# Patient Record
Sex: Female | Born: 1950 | Race: White | Hispanic: Refuse to answer | Marital: Married | Smoking: Never smoker
Health system: Southern US, Community
[De-identification: ages and names within clinical notes are randomized; demographics above are authoritative.]

## PROBLEM LIST (undated history)

## (undated) DIAGNOSIS — T4145XA Adverse effect of unspecified anesthetic, initial encounter: Secondary | ICD-10-CM

## (undated) DIAGNOSIS — T8859XA Other complications of anesthesia, initial encounter: Secondary | ICD-10-CM

---
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
## 2004-02-16 ENCOUNTER — Ambulatory Visit: Payer: Self-pay | Admitting: Internal Medicine

## 2005-09-14 ENCOUNTER — Ambulatory Visit: Payer: Self-pay | Admitting: Internal Medicine

## 2007-04-15 ENCOUNTER — Ambulatory Visit: Payer: Self-pay | Admitting: Nurse Practitioner

## 2009-05-10 ENCOUNTER — Ambulatory Visit: Payer: Self-pay | Admitting: Nurse Practitioner

## 2010-05-17 ENCOUNTER — Ambulatory Visit: Payer: Self-pay | Admitting: Family Medicine

## 2012-04-24 HISTORY — PX: SUBMANDIBULAR GLAND EXCISION: SHX2456

## 2015-09-03 ENCOUNTER — Other Ambulatory Visit: Payer: Self-pay | Admitting: Nurse Practitioner

## 2015-09-03 DIAGNOSIS — Z1231 Encounter for screening mammogram for malignant neoplasm of breast: Secondary | ICD-10-CM

## 2015-09-27 ENCOUNTER — Ambulatory Visit: Admission: RE | Admit: 2015-09-27 | Payer: BC Managed Care – PPO | Source: Ambulatory Visit

## 2015-11-18 ENCOUNTER — Ambulatory Visit
Admission: RE | Admit: 2015-11-18 | Discharge: 2015-11-18 | Disposition: A | Discharge status: Home / Self Care | Payer: BC Managed Care – PPO | Source: Ambulatory Visit | Attending: Nurse Practitioner | Admitting: Nurse Practitioner

## 2015-11-18 ENCOUNTER — Other Ambulatory Visit: Payer: Self-pay | Admitting: Nurse Practitioner

## 2015-11-18 DIAGNOSIS — Z1231 Encounter for screening mammogram for malignant neoplasm of breast: Secondary | ICD-10-CM | POA: Insufficient documentation

## 2016-09-07 ENCOUNTER — Other Ambulatory Visit: Payer: Self-pay | Admitting: Nurse Practitioner

## 2016-09-07 DIAGNOSIS — Z1231 Encounter for screening mammogram for malignant neoplasm of breast: Secondary | ICD-10-CM

## 2016-11-21 ENCOUNTER — Ambulatory Visit
Admission: RE | Admit: 2016-11-21 | Discharge: 2016-11-21 | Disposition: A | Discharge status: Home / Self Care | Payer: Medicare Other | Source: Ambulatory Visit | Attending: Nurse Practitioner | Admitting: Nurse Practitioner

## 2016-11-21 DIAGNOSIS — Z1231 Encounter for screening mammogram for malignant neoplasm of breast: Secondary | ICD-10-CM | POA: Diagnosis present

## 2017-09-10 ENCOUNTER — Other Ambulatory Visit: Payer: Self-pay | Admitting: Nurse Practitioner

## 2017-09-10 DIAGNOSIS — Z1231 Encounter for screening mammogram for malignant neoplasm of breast: Secondary | ICD-10-CM

## 2017-11-27 ENCOUNTER — Ambulatory Visit
Admission: RE | Admit: 2017-11-27 | Discharge: 2017-11-27 | Disposition: A | Discharge status: Home / Self Care | Payer: Medicare Other | Source: Ambulatory Visit | Attending: Nurse Practitioner | Admitting: Nurse Practitioner

## 2017-11-27 DIAGNOSIS — Z1231 Encounter for screening mammogram for malignant neoplasm of breast: Secondary | ICD-10-CM | POA: Diagnosis not present

## 2018-09-17 ENCOUNTER — Other Ambulatory Visit: Payer: Self-pay | Admitting: Nurse Practitioner

## 2018-09-17 DIAGNOSIS — R599 Enlarged lymph nodes, unspecified: Secondary | ICD-10-CM

## 2018-09-19 ENCOUNTER — Ambulatory Visit: Payer: Medicare Other

## 2018-09-20 ENCOUNTER — Encounter (INDEPENDENT_AMBULATORY_CARE_PROVIDER_SITE_OTHER): Payer: Self-pay

## 2018-09-20 ENCOUNTER — Ambulatory Visit
Admission: RE | Admit: 2018-09-20 | Discharge: 2018-09-20 | Disposition: A | Discharge status: Home / Self Care | Payer: Medicare Other | Source: Ambulatory Visit | Attending: Nurse Practitioner | Admitting: Nurse Practitioner

## 2018-09-20 ENCOUNTER — Other Ambulatory Visit: Payer: Self-pay

## 2018-09-20 DIAGNOSIS — R599 Enlarged lymph nodes, unspecified: Secondary | ICD-10-CM

## 2018-09-23 ENCOUNTER — Other Ambulatory Visit: Payer: Self-pay | Admitting: Nurse Practitioner

## 2018-09-23 DIAGNOSIS — L729 Follicular cyst of the skin and subcutaneous tissue, unspecified: Secondary | ICD-10-CM

## 2018-10-07 ENCOUNTER — Ambulatory Visit
Admission: RE | Admit: 2018-10-07 | Discharge: 2018-10-07 | Disposition: A | Discharge status: Home / Self Care | Payer: Medicare Other | Source: Ambulatory Visit | Attending: Nurse Practitioner | Admitting: Nurse Practitioner

## 2018-10-07 ENCOUNTER — Other Ambulatory Visit: Payer: Self-pay

## 2018-10-07 DIAGNOSIS — L729 Follicular cyst of the skin and subcutaneous tissue, unspecified: Secondary | ICD-10-CM | POA: Diagnosis not present

## 2018-10-07 DIAGNOSIS — I700 Atherosclerosis of aorta: Secondary | ICD-10-CM | POA: Insufficient documentation

## 2018-10-07 DIAGNOSIS — K4190 Unilateral femoral hernia, without obstruction or gangrene, not specified as recurrent: Secondary | ICD-10-CM | POA: Insufficient documentation

## 2018-10-07 MED ORDER — IOHEXOL 300 MG/ML  SOLN
100.0000 mL | Freq: Once | INTRAMUSCULAR | Status: AC | PRN
  Administered 2018-10-07: 100 mL via INTRAVENOUS

## 2018-11-01 ENCOUNTER — Ambulatory Visit: Payer: Self-pay | Admitting: Surgery

## 2018-11-01 NOTE — H&P (Addendum)
Subjective:   CC: Unilateral femoral hernia without obstruction or gangrene, recurrence not specified [K41.90]  HPI:  Patricia Bernard is a 68 y.o. female who was referred by Sallee Lange, NP for evaluation of above. Symptoms were first noted 4 months ago. Asymptomaic, but had a large lump there appear and now has decreased in size but still present.  Associated with nothing specific, exacerbated by nothing specific.  Lump is not reducible, but she states it does decrease in size at night. Patient has no symptoms of  difficulty urinating.    Past Medical History:  has a past medical history of Arthritis (2013), Chronic periodontal disease, IGT (impaired glucose tolerance), Inguinal hernia (09/2018), Osteoporosis, post-menopausal (02/16/04), Pure hypercholesterolemia, Retinal hemorrhage (2005), and Shingles (2006).  Past Surgical History:       Past Surgical History:  Procedure Laterality Date  . CESAREAN SECTION    . Dental Bone Graft  03/2018   Plant for dental implants x2 6/20  . ENDOMETRIAL BIOPSY  2005   2/2 post menopausal bleeding.  negative.  . Gum Surgery  2016  . Submandibular Gland Excision Right   . THERMAL ABLATION ENDOMETRIUM  2005   2/2 Post menopausal bleeding.    Family History: family history includes Bipolar disorder in her sister and sister; Coronary Artery Disease (Blocked arteries around heart) in her maternal aunt; Depression in her sister; Diabetes type II in her sister; High blood pressure (Hypertension) in her mother and sister; Hyperlipidemia (Elevated cholesterol) in her sister; Hyperthyroidism in her mother; Hypothyroidism in her sister; Myocardial Infarction (Heart attack) (age of onset: 50) in her paternal uncle; Osteoporosis (Thinning of bones) in her sister and sister; Stomach cancer (age of onset: 12) in her father; Stroke in her maternal aunt; Uterine cancer in her sister.  Social History:  reports that she has quit smoking. Her  smoking use included cigarettes. She smoked 0.00 packs per day for 20.00 years. She has never used smokeless tobacco. She reports current alcohol use. She reports that she does not use drugs.  Current Medications: has a current medication list which includes the following prescription(s): ascorbic acid (vitamin c), aspirin, docosahexaenoic acid/epa, garlic, magnesium carbonate, and rosuvastatin.  Allergies:       Allergies as of 11/01/2018 - Reviewed 11/01/2018  Allergen Reaction Noted  . Bisphosphonates Other (See Comments)     ROS:  A 15 point review of systems was performed and pertinent positives and negatives noted in HPI   Objective:   BP 157/77   Pulse 63   Ht 153.7 cm (5' 0.5")   Wt 52.2 kg (115 lb)   BMI 22.09 kg/m   Constitutional :  alert, appears stated age, cooperative and no distress  Lymphatics/Throat:  no asymmetry, masses, or scars  Respiratory:  clear to auscultation bilaterally  Cardiovascular:  regular rate and rhythm  Gastrointestinal: soft, non-tender; bowel sounds normal; no masses,  no organomegaly. femoral hernia noted.  small, incarcerated and no overlying skin changes, non-tender, RIGHT  Musculoskeletal: Steady gait and movement  Skin: Cool and moist, visible surgical scars midline from previous c-section  Psychiatric: Normal affect, non-agitated, not confused       LABS:  n/a   RADS:    CLINICAL DATA: Right lower quadrant cyst  EXAM: CT ABDOMEN AND PELVIS WITH CONTRAST  TECHNIQUE: Multidetector CT imaging of the abdomen and pelvis was performed using the standard protocol following bolus administration of intravenous contrast.  CONTRAST: 146mL OMNIPAQUE IOHEXOL 300 MG/ML SOLN, additional oral enteric  contrast  COMPARISON: Ultrasound, 09/20/2018  FINDINGS: Lower chest: No acute abnormality.  Hepatobiliary: No solid liver abnormality is seen. No gallstones, gallbladder wall thickening, or biliary dilatation.  Pancreas:  Unremarkable. No pancreatic ductal dilatation or surrounding inflammatory changes.  Spleen: Normal in size without significant abnormality.  Adrenals/Urinary Tract: Adrenal glands are unremarkable. Kidneys are normal, without renal calculi, solid lesion, or hydronephrosis. Bladder is unremarkable.  Stomach/Bowel: Stomach is within normal limits. Appendix appears normal. No evidence of bowel wall thickening, distention, or inflammatory changes.  Vascular/Lymphatic: Aortic atherosclerosis. No enlarged abdominal or pelvic lymph nodes.  Reproductive: No mass or other significant abnormality.  Other: There is a small right femoral hernia containing fat and a fluid attenuation object. This hernia measures 3.5 x 2.6 x 3.5 cm (series 2, image 70, series 5, image 32). No abdominopelvic ascites.  Musculoskeletal: No acute or significant osseous findings.  IMPRESSION: 1. There is a small right femoral hernia containing fat and a fluid attenuation object, which is likely a small volume of fluid although is possibly the right ovary with a cyst given appearance on prior ultrasound. This hernia measures 3.5 x 2.6 x 3.5 cm (series 2, image 70, series 5, image 32).  2. Other chronic, incidental, and postoperative findings as detailed above.   Electronically Signed  By: Lauralyn PrimesAlex Bibbey M.D.  On: 10/07/2018 15:24  Other Result Information     Assessment:       Unilateral femoral hernia without obstruction or gangrene, recurrence not specified [K41.90]   Plan:   1. Unilateral femoral hernia without obstruction or gangrene, recurrence not specified [K41.90]   Discussed the risk of surgery including recurrence, which can be up to 50% in the case of incisional or complex hernias, possible use of prosthetic materials (mesh) and the increased risk of mesh infxn if used, bleeding, chronic pain, post-op infxn, post-op SBO or ileus, and possible re-operation to address said risks. The risks  of general anesthetic, if used, includes MI, CVA, sudden death or even reaction to anesthetic medications also discussed. Alternatives include continued observation.  Benefits include possible symptom relief, prevention of incarceration, strangulation, enlargement in size over time, and the risk of emergency surgery in the face of strangulation.   Typical post-op recovery time of 3-5 days with 4-6 weeks of activity restrictions were also discussed.  ED return precautions given for sudden increase in pain, size of hernia with accompanying fever, nausea, and/or vomiting.  The patient verbalized understanding and all questions were answered to the patient's satisfaction.   2. Due to increased risk of complications from a femoral hernia compared to other hernias, recommended repair even if it is asymptomatic.  Robotic-assisted approach will be best for the location.  OB-GYN will be involved with bilateral oophorectomy at the same time, due to possible involvement.  Please refer to Dr. Jolyn NapWard's note for further details

## 2018-12-03 ENCOUNTER — Other Ambulatory Visit: Payer: Self-pay

## 2018-12-03 ENCOUNTER — Encounter
Admission: RE | Admit: 2018-12-03 | Discharge: 2018-12-03 | Disposition: A | Discharge status: Home / Self Care | Payer: Medicare Other | Source: Ambulatory Visit | Attending: Surgery | Admitting: Surgery

## 2018-12-03 DIAGNOSIS — E7800 Pure hypercholesterolemia, unspecified: Secondary | ICD-10-CM | POA: Diagnosis not present

## 2018-12-03 DIAGNOSIS — K4030 Unilateral inguinal hernia, with obstruction, without gangrene, not specified as recurrent: Secondary | ICD-10-CM | POA: Diagnosis not present

## 2018-12-03 DIAGNOSIS — Z20828 Contact with and (suspected) exposure to other viral communicable diseases: Secondary | ICD-10-CM | POA: Diagnosis not present

## 2018-12-03 DIAGNOSIS — N83201 Unspecified ovarian cyst, right side: Secondary | ICD-10-CM | POA: Diagnosis not present

## 2018-12-03 DIAGNOSIS — Z87891 Personal history of nicotine dependence: Secondary | ICD-10-CM | POA: Diagnosis not present

## 2018-12-03 DIAGNOSIS — Z7982 Long term (current) use of aspirin: Secondary | ICD-10-CM | POA: Diagnosis not present

## 2018-12-03 DIAGNOSIS — K4190 Unilateral femoral hernia, without obstruction or gangrene, not specified as recurrent: Secondary | ICD-10-CM | POA: Diagnosis present

## 2018-12-03 DIAGNOSIS — K660 Peritoneal adhesions (postprocedural) (postinfection): Secondary | ICD-10-CM | POA: Diagnosis not present

## 2018-12-03 HISTORY — DX: Other complications of anesthesia, initial encounter: T88.59XA

## 2018-12-03 LAB — SARS CORONAVIRUS 2 (TAT 6-24 HRS): SARS Coronavirus 2: NEGATIVE

## 2018-12-03 NOTE — Progress Notes (Signed)
During the PAT visit, the patient expressed her wish to have her surgical consent with Dr. Leonides Schanz changed to only include the right salpingo-oopherectomy NOT bilateral as previously stated. I called Dr. Guido Sander office and spoke with her nurse. She will make Dr. Leonides Schanz aware. A blank consent is placed on the chart to be filled out and signed morning of surgery.

## 2018-12-03 NOTE — Patient Instructions (Addendum)
Your procedure is scheduled on: Friday 12/06/18  Report to [facility]. To find out your arrival time please call [phone] between 1PM - 3PM on Thursday 12/05/18.  Remember: Instructions that are not followed completely may result in serious medical risk, up to and including death, or upon the discretion of your surgeon and anesthesiologist your surgery may need to be rescheduled.      _X__ 1. Do not eat food after midnight the night before your procedure.                 No gum chewing or hard candies. You may drink clear liquids up to 2 hours                 before you are scheduled to arrive for your surgery- DO NOT drink clear                 liquids within 2 hours of the start of your surgery.                 Clear Liquids include:  water, apple juice without pulp, clear carbohydrate                 drink such as Clearfast or Gatorade, Black Coffee or Tea (Do not add                 milk or creamer to coffee or tea).   __X__2.  On the morning of surgery brush your teeth with toothpaste and water, you may rinse your mouth with mouthwash if you wish.  Do not swallow any toothpaste or mouthwash.      _X__ 3.  No Alcohol for 24 hours before or after surgery.   __X__4.  Notify your doctor if there is any change in your medical condition      (cold, fever, infections).     Do not wear jewelry, make-up, hairpins, clips or nail polish. Do not wear lotions, powders, or perfumes.  Do not shave 48 hours prior to surgery. Men may shave face and neck. Do not bring valuables to the hospital.    [facility] is not responsible for any belongings or valuables.   Contacts, dentures/partials or body piercings may not be worn into surgery. Bring a case for your contacts, glasses or hearing aids, a denture cup will be supplied.   Patients discharged the day of surgery will not be allowed to drive home.   __X__ Take these medicines the  morning of surgery with A SIP OF WATER:     1. NONE    __X__ Use CHG Soap as directed   __X__ Stop Anti-inflammatories 7 days before surgery such as Advil, Ibuprofen, Motrin, BC or Goodies Powder, Naprosyn, Naproxen, Aleve, Aspirin, Meloxicam. May take Tylenol if needed for pain or discomfort.  **You stopped taking your daily low dose Aspirin on 11/30/18.    __X__ Stop all herbal supplements prior to your procedure.

## 2018-12-03 NOTE — Progress Notes (Signed)
EKG reviewed by Dr. Randa Lynn. Same as one in [place]. Okay to proceed.

## 2018-12-05 MED ORDER — CEFAZOLIN SODIUM-DEXTROSE 2-4 GM/100ML-% IV SOLN
2.0000 g | INTRAVENOUS | Status: AC
  Administered 2018-12-06: 2 g via INTRAVENOUS

## 2018-12-06 ENCOUNTER — Ambulatory Visit: Payer: Medicare Other | Admitting: Anesthesiology

## 2018-12-06 ENCOUNTER — Other Ambulatory Visit: Payer: Self-pay

## 2018-12-06 ENCOUNTER — Ambulatory Visit
Admission: RE | Admit: 2018-12-06 | Discharge: 2018-12-06 | Disposition: A | Discharge status: Home / Self Care | Payer: Medicare Other | Attending: Surgery | Admitting: Surgery

## 2018-12-06 ENCOUNTER — Encounter
Admission: RE | Disposition: A | Discharge status: Home / Self Care | Payer: Self-pay | Source: Home / Self Care | Attending: Surgery

## 2018-12-06 ENCOUNTER — Encounter: Payer: Self-pay | Admitting: *Deleted

## 2018-12-06 DIAGNOSIS — Z87891 Personal history of nicotine dependence: Secondary | ICD-10-CM | POA: Insufficient documentation

## 2018-12-06 DIAGNOSIS — K4090 Unilateral inguinal hernia, without obstruction or gangrene, not specified as recurrent: Secondary | ICD-10-CM

## 2018-12-06 DIAGNOSIS — K4030 Unilateral inguinal hernia, with obstruction, without gangrene, not specified as recurrent: Secondary | ICD-10-CM | POA: Diagnosis not present

## 2018-12-06 DIAGNOSIS — K660 Peritoneal adhesions (postprocedural) (postinfection): Secondary | ICD-10-CM | POA: Insufficient documentation

## 2018-12-06 DIAGNOSIS — Z20828 Contact with and (suspected) exposure to other viral communicable diseases: Secondary | ICD-10-CM | POA: Insufficient documentation

## 2018-12-06 DIAGNOSIS — E7800 Pure hypercholesterolemia, unspecified: Secondary | ICD-10-CM | POA: Insufficient documentation

## 2018-12-06 DIAGNOSIS — N83201 Unspecified ovarian cyst, right side: Secondary | ICD-10-CM | POA: Insufficient documentation

## 2018-12-06 DIAGNOSIS — Z7982 Long term (current) use of aspirin: Secondary | ICD-10-CM | POA: Insufficient documentation

## 2018-12-06 HISTORY — PX: ROBOTIC ASSISTED SALPINGO OOPHERECTOMY: SHX6082

## 2018-12-06 HISTORY — PX: XI ROBOTIC ASSISTED INGUINAL HERNIA REPAIR WITH MESH: SHX6706

## 2018-12-06 LAB — URINE DRUG SCREEN, QUALITATIVE (ARMC ONLY)
Amphetamines, Ur Screen: NOT DETECTED
Barbiturates, Ur Screen: NOT DETECTED
Benzodiazepine, Ur Scrn: NOT DETECTED
Cannabinoid 50 Ng, Ur ~~LOC~~: NOT DETECTED
Cocaine Metabolite,Ur ~~LOC~~: NOT DETECTED
MDMA (Ecstasy)Ur Screen: NOT DETECTED
Methadone Scn, Ur: NOT DETECTED
Opiate, Ur Screen: NOT DETECTED
Phencyclidine (PCP) Ur S: NOT DETECTED
Tricyclic, Ur Screen: NOT DETECTED

## 2018-12-06 SURGERY — REPAIR, HERNIA, INGUINAL, ROBOT-ASSISTED, LAPAROSCOPIC, USING MESH
Anesthesia: General | Site: Abdomen | Laterality: Right

## 2018-12-06 MED ORDER — SUGAMMADEX SODIUM 200 MG/2ML IV SOLN
INTRAVENOUS | Status: DC | PRN
  Administered 2018-12-06: 150 mg via INTRAVENOUS

## 2018-12-06 MED ORDER — BUPIVACAINE-EPINEPHRINE 0.5% -1:200000 IJ SOLN
INTRAMUSCULAR | Status: DC | PRN
  Administered 2018-12-06: 10 mL

## 2018-12-06 MED ORDER — DEXAMETHASONE SODIUM PHOSPHATE 10 MG/ML IJ SOLN
INTRAMUSCULAR | Status: DC | PRN
  Administered 2018-12-06: 10 mg via INTRAVENOUS

## 2018-12-06 MED ORDER — SUCCINYLCHOLINE CHLORIDE 20 MG/ML IJ SOLN
INTRAMUSCULAR | Status: DC | PRN
  Administered 2018-12-06: 80 mg via INTRAVENOUS

## 2018-12-06 MED ORDER — MEPERIDINE HCL 50 MG/ML IJ SOLN: 6.2500 mg | INTRAMUSCULAR | Status: DC | PRN

## 2018-12-06 MED ORDER — ACETAMINOPHEN 325 MG PO TABS: 650.0000 mg | ORAL_TABLET | Freq: Three times a day (TID) | ORAL | 0 refills | Status: AC | PRN

## 2018-12-06 MED ORDER — FAMOTIDINE 20 MG PO TABS
ORAL_TABLET | ORAL | Status: AC
  Administered 2018-12-06: 12:00:00 20 mg via ORAL
  Filled 2018-12-06: qty 1

## 2018-12-06 MED ORDER — OXYCODONE HCL 5 MG/5ML PO SOLN: 5.0000 mg | Freq: Once | ORAL | Status: DC | PRN

## 2018-12-06 MED ORDER — ONDANSETRON HCL 4 MG/2ML IJ SOLN
INTRAMUSCULAR | Status: AC
  Filled 2018-12-06: qty 2

## 2018-12-06 MED ORDER — PROMETHAZINE HCL 25 MG/ML IJ SOLN: 6.2500 mg | INTRAMUSCULAR | Status: DC | PRN

## 2018-12-06 MED ORDER — ACETAMINOPHEN 500 MG PO TABS
ORAL_TABLET | ORAL | Status: AC
  Administered 2018-12-06: 12:00:00 1000 mg via ORAL
  Filled 2018-12-06: qty 2

## 2018-12-06 MED ORDER — BUPIVACAINE LIPOSOME 1.3 % IJ SUSP
INTRAMUSCULAR | Status: AC
  Filled 2018-12-06: qty 20

## 2018-12-06 MED ORDER — CEFAZOLIN SODIUM-DEXTROSE 2-4 GM/100ML-% IV SOLN
INTRAVENOUS | Status: AC
  Filled 2018-12-06: qty 100

## 2018-12-06 MED ORDER — BUPIVACAINE LIPOSOME 1.3 % IJ SUSP
INTRAMUSCULAR | Status: DC | PRN
  Administered 2018-12-06: 20 mL

## 2018-12-06 MED ORDER — GLYCOPYRROLATE 0.2 MG/ML IJ SOLN
INTRAMUSCULAR | Status: DC | PRN
  Administered 2018-12-06: 0.2 mg via INTRAVENOUS

## 2018-12-06 MED ORDER — CELECOXIB 200 MG PO CAPS
ORAL_CAPSULE | ORAL | Status: AC
  Administered 2018-12-06: 200 mg via ORAL
  Filled 2018-12-06: qty 1

## 2018-12-06 MED ORDER — SUCCINYLCHOLINE CHLORIDE 20 MG/ML IJ SOLN
INTRAMUSCULAR | Status: AC
  Filled 2018-12-06: qty 1

## 2018-12-06 MED ORDER — HYDROCODONE-ACETAMINOPHEN 5-325 MG PO TABS: 1.0000 | ORAL_TABLET | Freq: Four times a day (QID) | ORAL | 0 refills | Status: AC | PRN

## 2018-12-06 MED ORDER — FENTANYL CITRATE (PF) 100 MCG/2ML IJ SOLN
INTRAMUSCULAR | Status: AC
  Administered 2018-12-06: 18:00:00 50 ug via INTRAVENOUS
  Filled 2018-12-06: qty 2

## 2018-12-06 MED ORDER — OXYCODONE HCL 5 MG PO TABS: 5.0000 mg | ORAL_TABLET | Freq: Once | ORAL | Status: DC | PRN

## 2018-12-06 MED ORDER — PROPOFOL 10 MG/ML IV BOLUS
INTRAVENOUS | Status: DC | PRN
  Administered 2018-12-06: 140 mg via INTRAVENOUS

## 2018-12-06 MED ORDER — DOCUSATE SODIUM 100 MG PO CAPS: 100.0000 mg | ORAL_CAPSULE | Freq: Two times a day (BID) | ORAL | 0 refills | Status: AC | PRN

## 2018-12-06 MED ORDER — SUGAMMADEX SODIUM 200 MG/2ML IV SOLN
INTRAVENOUS | Status: AC
  Filled 2018-12-06: qty 2

## 2018-12-06 MED ORDER — CELECOXIB 200 MG PO CAPS
200.0000 mg | ORAL_CAPSULE | ORAL | Status: AC
  Administered 2018-12-06: 12:00:00 200 mg via ORAL

## 2018-12-06 MED ORDER — ONDANSETRON HCL 4 MG/2ML IJ SOLN
INTRAMUSCULAR | Status: DC | PRN
  Administered 2018-12-06: 4 mg via INTRAVENOUS

## 2018-12-06 MED ORDER — IBUPROFEN 800 MG PO TABS: 800.0000 mg | ORAL_TABLET | Freq: Three times a day (TID) | ORAL | 0 refills | Status: AC | PRN

## 2018-12-06 MED ORDER — DEXAMETHASONE SODIUM PHOSPHATE 10 MG/ML IJ SOLN
INTRAMUSCULAR | Status: AC
  Filled 2018-12-06: qty 1

## 2018-12-06 MED ORDER — FENTANYL CITRATE (PF) 100 MCG/2ML IJ SOLN
25.0000 ug | INTRAMUSCULAR | Status: DC | PRN
  Administered 2018-12-06: 18:00:00 50 ug via INTRAVENOUS

## 2018-12-06 MED ORDER — BUPIVACAINE-EPINEPHRINE (PF) 0.5% -1:200000 IJ SOLN
INTRAMUSCULAR | Status: AC
  Filled 2018-12-06: qty 30

## 2018-12-06 MED ORDER — GABAPENTIN 300 MG PO CAPS
ORAL_CAPSULE | ORAL | Status: AC
  Administered 2018-12-06: 12:00:00 300 mg via ORAL
  Filled 2018-12-06: qty 1

## 2018-12-06 MED ORDER — PROPOFOL 10 MG/ML IV BOLUS
INTRAVENOUS | Status: AC
  Filled 2018-12-06: qty 20

## 2018-12-06 MED ORDER — LACTATED RINGERS IV SOLN
INTRAVENOUS | Status: DC
  Administered 2018-12-06 (×2): via INTRAVENOUS

## 2018-12-06 MED ORDER — GABAPENTIN 300 MG PO CAPS
300.0000 mg | ORAL_CAPSULE | ORAL | Status: AC
  Administered 2018-12-06: 12:00:00 300 mg via ORAL

## 2018-12-06 MED ORDER — LIDOCAINE HCL (CARDIAC) PF 100 MG/5ML IV SOSY
PREFILLED_SYRINGE | INTRAVENOUS | Status: DC | PRN
  Administered 2018-12-06: 70 mg via INTRAVENOUS

## 2018-12-06 MED ORDER — BUPIVACAINE HCL (PF) 0.5 % IJ SOLN
INTRAMUSCULAR | Status: AC
  Filled 2018-12-06: qty 30

## 2018-12-06 MED ORDER — CHLORHEXIDINE GLUCONATE CLOTH 2 % EX PADS: 6.0000 | MEDICATED_PAD | Freq: Once | CUTANEOUS | Status: DC

## 2018-12-06 MED ORDER — LIDOCAINE-EPINEPHRINE (PF) 1 %-1:200000 IJ SOLN
INTRAMUSCULAR | Status: AC
  Filled 2018-12-06: qty 30

## 2018-12-06 MED ORDER — FAMOTIDINE 20 MG PO TABS
20.0000 mg | ORAL_TABLET | Freq: Once | ORAL | Status: AC
  Administered 2018-12-06: 12:00:00 20 mg via ORAL

## 2018-12-06 MED ORDER — PHENYLEPHRINE HCL (PRESSORS) 10 MG/ML IV SOLN
INTRAVENOUS | Status: DC | PRN
  Administered 2018-12-06 (×3): 100 ug via INTRAVENOUS

## 2018-12-06 MED ORDER — ROCURONIUM BROMIDE 50 MG/5ML IV SOLN
INTRAVENOUS | Status: AC
  Filled 2018-12-06: qty 1

## 2018-12-06 MED ORDER — FENTANYL CITRATE (PF) 100 MCG/2ML IJ SOLN
INTRAMUSCULAR | Status: AC
  Filled 2018-12-06: qty 2

## 2018-12-06 MED ORDER — ACETAMINOPHEN 500 MG PO TABS
1000.0000 mg | ORAL_TABLET | ORAL | Status: AC
  Administered 2018-12-06: 12:00:00 1000 mg via ORAL

## 2018-12-06 MED ORDER — ROCURONIUM BROMIDE 100 MG/10ML IV SOLN
INTRAVENOUS | Status: DC | PRN
  Administered 2018-12-06: 10 mg via INTRAVENOUS
  Administered 2018-12-06: 30 mg via INTRAVENOUS
  Administered 2018-12-06 (×2): 10 mg via INTRAVENOUS

## 2018-12-06 MED ORDER — FENTANYL CITRATE (PF) 100 MCG/2ML IJ SOLN
INTRAMUSCULAR | Status: DC | PRN
  Administered 2018-12-06: 25 ug via INTRAVENOUS
  Administered 2018-12-06: 75 ug via INTRAVENOUS

## 2018-12-06 MED ORDER — LIDOCAINE HCL (PF) 2 % IJ SOLN
INTRAMUSCULAR | Status: AC
  Filled 2018-12-06: qty 10

## 2018-12-06 SURGICAL SUPPLY — 84 items
BAG INFUSER PRESSURE 100CC (MISCELLANEOUS) IMPLANT
BAG URINE DRAINAGE (UROLOGICAL SUPPLIES) ×3 IMPLANT
BLADE SURG SZ11 CARB STEEL (BLADE) ×8 IMPLANT
BNDG GAUZE 4.5X4.1 6PLY STRL (MISCELLANEOUS) ×3 IMPLANT
CANISTER SUCT 1200ML W/VALVE (MISCELLANEOUS) ×10 IMPLANT
CANNULA REDUC XI 12-8 STAPL (CANNULA) ×1
CANNULA REDUC XI 12-8MM STAPL (CANNULA) ×1
CANNULA REDUCER 12-8 DVNC XI (CANNULA) ×3 IMPLANT
CATH FOLEY 2WAY  5CC 16FR (CATHETERS)
CATH URTH 16FR FL 2W BLN LF (CATHETERS) ×3 IMPLANT
CHLORAPREP W/TINT 26 (MISCELLANEOUS) ×8 IMPLANT
COVER TIP SHEARS 8 DVNC (MISCELLANEOUS) ×3 IMPLANT
COVER TIP SHEARS 8MM DA VINCI (MISCELLANEOUS) ×2
COVER WAND RF STERILE (DRAPES) ×8 IMPLANT
DEFOGGER SCOPE WARMER CLEARIFY (MISCELLANEOUS) ×5 IMPLANT
DERMABOND ADVANCED (GAUZE/BANDAGES/DRESSINGS) ×2
DERMABOND ADVANCED .7 DNX12 (GAUZE/BANDAGES/DRESSINGS) ×3 IMPLANT
DRAPE 3/4 80X56 (DRAPES) ×5 IMPLANT
DRAPE ARM DVNC X/XI (DISPOSABLE) ×12 IMPLANT
DRAPE COLUMN DVNC XI (DISPOSABLE) ×3 IMPLANT
DRAPE DA VINCI XI ARM (DISPOSABLE) ×8
DRAPE DA VINCI XI COLUMN (DISPOSABLE) ×2
DRAPE LEGGINS SURG 28X43 STRL (DRAPES) ×3 IMPLANT
DRAPE UNDER BUTTOCK W/FLU (DRAPES) ×3 IMPLANT
ELECT REM PT RETURN 9FT ADLT (ELECTROSURGICAL) ×5
ELECTRODE REM PT RTRN 9FT ADLT (ELECTROSURGICAL) ×3 IMPLANT
ETHIBOND 2 0 GREEN CT 2 30IN (SUTURE) ×6 IMPLANT
GLOVE BIOGEL PI IND STRL 7.0 (GLOVE) ×6 IMPLANT
GLOVE BIOGEL PI INDICATOR 7.0 (GLOVE) ×4
GLOVE PI ORTHOPRO 6.5 (GLOVE) ×2
GLOVE PI ORTHOPRO STRL 6.5 (GLOVE) ×3 IMPLANT
GLOVE SURG SYN 6.5 ES PF (GLOVE) ×15 IMPLANT
GLOVE SURG SYN 6.5 PF PI (GLOVE) ×9 IMPLANT
GOWN STRL REUS W/ TWL LRG LVL3 (GOWN DISPOSABLE) ×15 IMPLANT
GOWN STRL REUS W/ TWL XL LVL3 (GOWN DISPOSABLE) ×3 IMPLANT
GOWN STRL REUS W/TWL LRG LVL3 (GOWN DISPOSABLE) ×6
GOWN STRL REUS W/TWL XL LVL3 (GOWN DISPOSABLE) ×2
GRASPER SUT TROCAR 14GX15 (MISCELLANEOUS) ×3 IMPLANT
IRRIGATION STRYKERFLOW (MISCELLANEOUS) IMPLANT
IRRIGATOR STRYKERFLOW (MISCELLANEOUS)
IRRIGATOR SUCT 8 DISP DVNC XI (IRRIGATION / IRRIGATOR) IMPLANT
IRRIGATOR SUCTION 8MM XI DISP (IRRIGATION / IRRIGATOR)
IV LACTATED RINGERS 1000ML (IV SOLUTION) ×3 IMPLANT
IV NS 1000ML (IV SOLUTION)
IV NS 1000ML BAXH (IV SOLUTION) IMPLANT
KIT PINK PAD W/HEAD ARE REST (MISCELLANEOUS) ×5
KIT PINK PAD W/HEAD ARM REST (MISCELLANEOUS) ×6 IMPLANT
KIT TURNOVER CYSTO (KITS) ×5 IMPLANT
L-HOOK LAP DISP 36CM (ELECTROSURGICAL)
LABEL OR SOLS (LABEL) ×5 IMPLANT
LHOOK LAP DISP 36CM (ELECTROSURGICAL) IMPLANT
LIGASURE VESSEL 5MM BLUNT TIP (ELECTROSURGICAL) IMPLANT
MANIPULATOR UTERINE 4.5 ZUMI (MISCELLANEOUS) IMPLANT
MESH 3DMAX 4X6 RT LRG (Mesh General) ×5 IMPLANT
NEEDLE HYPO 22GX1.5 SAFETY (NEEDLE) ×5 IMPLANT
NEEDLE VERESS 14GA 120MM (NEEDLE) ×5 IMPLANT
NS IRRIG 500ML POUR BTL (IV SOLUTION) ×5 IMPLANT
OBTURATOR OPTICAL STANDARD 8MM (TROCAR) ×2
OBTURATOR OPTICAL STND 8 DVNC (TROCAR) ×3
OBTURATOR OPTICALSTD 8 DVNC (TROCAR) ×3 IMPLANT
PACK LAP CHOLECYSTECTOMY (MISCELLANEOUS) ×8 IMPLANT
PAD OB MATERNITY 4.3X12.25 (PERSONAL CARE ITEMS) ×3 IMPLANT
PAD PREP 24X41 OB/GYN DISP (PERSONAL CARE ITEMS) ×3 IMPLANT
POUCH SPECIMEN RETRIEVAL 10MM (ENDOMECHANICALS) ×2 IMPLANT
SEAL CANN UNIV 5-8 DVNC XI (MISCELLANEOUS) ×6 IMPLANT
SEAL XI 5MM-8MM UNIVERSAL (MISCELLANEOUS) ×4
SET TUBE SMOKE EVAC HIGH FLOW (TUBING) ×3 IMPLANT
SLEEVE ENDOPATH XCEL 5M (ENDOMECHANICALS) ×6 IMPLANT
SOLUTION ELECTROLUBE (MISCELLANEOUS) ×5 IMPLANT
STAPLER CANNULA SEAL DVNC XI (STAPLE) ×3 IMPLANT
STAPLER CANNULA SEAL XI (STAPLE) ×2
SUT DVC VLOC 3-0 CL 6 P-12 (SUTURE) ×8 IMPLANT
SUT MNCRL 4-0 (SUTURE) ×4
SUT MNCRL 4-0 27XMFL (SUTURE) ×6
SUT MNCRL AB 4-0 PS2 18 (SUTURE) ×6 IMPLANT
SUT VIC AB 3-0 SH 27 (SUTURE) ×2
SUT VIC AB 3-0 SH 27X BRD (SUTURE) ×3 IMPLANT
SUT VICRYL 0 AB UR-6 (SUTURE) ×3 IMPLANT
SUTURE MNCRL 4-0 27XMF (SUTURE) ×3 IMPLANT
SYR 30ML LL (SYRINGE) ×3 IMPLANT
TRAY FOLEY MTR SLVR 16FR STAT (SET/KITS/TRAYS/PACK) ×5 IMPLANT
TROCAR ENDO BLADELESS 11MM (ENDOMECHANICALS) IMPLANT
TROCAR XCEL NON-BLD 5MMX100MML (ENDOMECHANICALS) ×8 IMPLANT
TUBING EVAC SMOKE HEATED PNEUM (TUBING) ×5 IMPLANT

## 2018-12-06 NOTE — Transfer of Care (Signed)
Immediate Anesthesia Transfer of Care Note  Patient: LASSIE DEMOREST  Procedure(s) Performed: XI ROBOTIC ASSISTED FEMORAL HERNIA REPAIR WITH MESH (N/A Abdomen) ROBOTIC ASSISTED SALPINGO OOPHORECTOMY (Right Abdomen)  Patient Location: PACU  Anesthesia Type:General  Level of Consciousness: awake and alert   Airway & Oxygen Therapy: Patient Spontanous Breathing and Patient connected to face mask oxygen  Post-op Assessment: Report given to RN and Post -op Vital signs reviewed and stable  Post vital signs: Reviewed and stable  Last Vitals:  Vitals Value Taken Time  BP 138/52 12/06/18 1751  Temp 37.2 C 12/06/18 1750  Pulse 54 12/06/18 1752  Resp 11 12/06/18 1752  SpO2 100 % 12/06/18 1752  Vitals shown include unvalidated device data.  Last Pain:  Vitals:   12/06/18 1750  TempSrc:   PainSc: Asleep         Complications: No apparent anesthesia complications

## 2018-12-06 NOTE — H&P (Signed)
Subjective:   CC: Unilateral femoral hernia without obstruction or gangrene, recurrence not specified [K41.90]  HPI: Patricia Bernard is a 68 y.o. female who was referred by Sallee Lange, NP for evaluation of above. Symptoms were first noted 4 months ago. Asymptomaic, but had a large lump there appear and now has decreased in size but still present. Associated with nothing specific, exacerbated by nothing specific. Lump is not reducible, but she states it does decrease in size at night. Patient has no symptoms of difficulty urinating.   Past Medical History: has a past medical history of Arthritis (2013), Chronic periodontal disease, IGT (impaired glucose tolerance), Inguinal hernia (09/2018), Osteoporosis, post-menopausal (02/16/04), Pure hypercholesterolemia, Retinal hemorrhage (2005), and Shingles (2006).  Past Surgical History:  Past Surgical History:  Procedure Laterality Date  . CESAREAN SECTION  . Dental Bone Graft 03/2018  Plant for dental implants x2 6/20  . ENDOMETRIAL BIOPSY 2005  2/2 post menopausal bleeding. negative.  . Gum Surgery 2016  . Submandibular Gland Excision Right  . THERMAL ABLATION ENDOMETRIUM 2005  2/2 Post menopausal bleeding.   Family History: family history includes Bipolar disorder in her sister and sister; Coronary Artery Disease (Blocked arteries around heart) in her maternal aunt; Depression in her sister; Diabetes type II in her sister; High blood pressure (Hypertension) in her mother and sister; Hyperlipidemia (Elevated cholesterol) in her sister; Hyperthyroidism in her mother; Hypothyroidism in her sister; Myocardial Infarction (Heart attack) (age of onset: 74) in her paternal uncle; Osteoporosis (Thinning of bones) in her sister and sister; Stomach cancer (age of onset: 73) in her father; Stroke in her maternal aunt; Uterine cancer in her sister.  Social History: reports that she has quit smoking. Her smoking use included cigarettes. She smoked  0.00 packs per day for 20.00 years. She has never used smokeless tobacco. She reports current alcohol use. She reports that she does not use drugs.  Current Medications: has a current medication list which includes the following prescription(s): ascorbic acid (vitamin c), aspirin, docosahexaenoic acid/epa, garlic, magnesium carbonate, and rosuvastatin.  Allergies:  Allergies as of 11/01/2018 - Reviewed 11/01/2018  Allergen Reaction Noted  . Bisphosphonates Other (See Comments)   ROS:  A 15 point review of systems was performed and pertinent positives and negatives noted in HPI  Objective:    BP 157/77  Pulse 63  Ht 153.7 cm (5' 0.5")  Wt 52.2 kg (115 lb)  BMI 22.09 kg/m   Constitutional : alert, appears stated age, cooperative and no distress  Lymphatics/Throat: no asymmetry, masses, or scars  Respiratory: clear to auscultation bilaterally  Cardiovascular: regular rate and rhythm  Gastrointestinal: soft, non-tender; bowel sounds normal; no masses, no organomegaly. femoral hernia noted. small, incarcerated and no overlying skin changes, non-tender, RIGHT  Musculoskeletal: Steady gait and movement  Skin: Cool and moist, visible surgical scars midline from previous c-section  Psychiatric: Normal affect, non-agitated, not confused    LABS:  n/a   RADS: CLINICAL DATA: Right lower quadrant cyst  EXAM: CT ABDOMEN AND PELVIS WITH CONTRAST  TECHNIQUE: Multidetector CT imaging of the abdomen and pelvis was performed using the standard protocol following bolus administration of intravenous contrast.  CONTRAST: 160mL OMNIPAQUE IOHEXOL 300 MG/ML SOLN, additional oral enteric contrast  COMPARISON: Ultrasound, 09/20/2018  FINDINGS: Lower chest: No acute abnormality.  Hepatobiliary: No solid liver abnormality is seen. No gallstones, gallbladder wall thickening, or biliary dilatation.  Pancreas: Unremarkable. No pancreatic ductal dilatation or surrounding inflammatory  changes.  Spleen: Normal in size without significant  abnormality.  Adrenals/Urinary Tract: Adrenal glands are unremarkable. Kidneys are normal, without renal calculi, solid lesion, or hydronephrosis. Bladder is unremarkable.  Stomach/Bowel: Stomach is within normal limits. Appendix appears normal. No evidence of bowel wall thickening, distention, or inflammatory changes.  Vascular/Lymphatic: Aortic atherosclerosis. No enlarged abdominal or pelvic lymph nodes.  Reproductive: No mass or other significant abnormality.  Other: There is a small right femoral hernia containing fat and a fluid attenuation object. This hernia measures 3.5 x 2.6 x 3.5 cm (series 2, image 70, series 5, image 32). No abdominopelvic ascites.  Musculoskeletal: No acute or significant osseous findings.  IMPRESSION: 1. There is a small right femoral hernia containing fat and a fluid attenuation object, which is likely a small volume of fluid although is possibly the right ovary with a cyst given appearance on prior ultrasound. This hernia measures 3.5 x 2.6 x 3.5 cm (series 2, image 70, series 5, image 32).  2. Other chronic, incidental, and postoperative findings as detailed above.  Electronically Signed By: Lauralyn PrimesAlex Bibbey M.D. On: 10/07/2018 15:24  Other Result Information   Assessment:    Unilateral femoral hernia without obstruction or gangrene, recurrence not specified [K41.90]  Right ovarian cyst  Plan:    1. Unilateral femoral hernia without obstruction or gangrene, recurrence not specified [K41.90]  Discussed the risk of surgery including recurrence, which can be up to 50% in the case of incisional or complex hernias, possible use of prosthetic materials (mesh) and the increased risk of mesh infxn if used, bleeding, chronic pain, post-op infxn, post-op SBO or ileus, and possible re-operation to address said risks. The risks of general anesthetic, if used, includes MI, CVA, sudden death or  even reaction to anesthetic medications also discussed. Alternatives include continued observation. Benefits include possible symptom relief, prevention of incarceration, strangulation, enlargement in size over time, and the risk of emergency surgery in the face of strangulation.   Typical post-op recovery time of 3-5 days with 4-6 weeks of activity restrictions were also discussed.  ED return precautions given for sudden increase in pain, size of hernia with accompanying fever, nausea, and/or vomiting.  The patient verbalized understanding and all questions were answered to the patient's satisfaction.  2. Due to increased risk of complications from a femoral hernia compared to other hernias, recommended repair even if it is asymptomatic. Robotic-assisted approach will be best for the location. OB-GYN will be involved with bilateral oophorectomy at the same time, due to possible involvement. Please refer to Dr. Jolyn NapWard's note for further details

## 2018-12-06 NOTE — Anesthesia Procedure Notes (Signed)
Procedure Name: Intubation Performed by: Rona Ravens, CRNA Pre-anesthesia Checklist: Patient identified, Emergency Drugs available, Suction available, Patient being monitored and Timeout performed Patient Re-evaluated:Patient Re-evaluated prior to induction Oxygen Delivery Method: Circle system utilized Preoxygenation: Pre-oxygenation with 100% oxygen Induction Type: IV induction and Rapid sequence Laryngoscope Size: Mac and 3 Grade View: Grade II Tube type: Oral Tube size: 7.0 mm Number of attempts: 1 Airway Equipment and Method: Stylet Placement Confirmation: ETT inserted through vocal cords under direct vision,  positive ETCO2,  CO2 detector and breath sounds checked- equal and bilateral Secured at: 21 cm Tube secured with: Tape Dental Injury: Teeth and Oropharynx as per pre-operative assessment

## 2018-12-06 NOTE — Discharge Instructions (Signed)
Hernia repair, Care After This sheet gives you information about how to care for yourself after your procedure. Your health care provider may also give you more specific instructions. If you have problems or questions, contact your health care provider. What can I expect after the procedure? After your procedure, it is common to have the following:  Pain in your abdomen, especially in the incision areas. You will be given medicine to control the pain.  Tiredness. This is a normal part of the recovery process. Your energy level will return to normal over the next several weeks.  Changes in your bowel movements, such as constipation or needing to go more often. Talk with your health care provider about how to manage this. Follow these instructions at home: Medicines   tylenol and advil as needed for discomfort.  Please alternate between the two every four hours as needed for pain.     Use narcotics, if prescribed, only when tylenol and motrin is not enough to control pain.   325-650mg  every 8hrs to max of 3000mg /24hrs (including the 325mg  in every norco dose) for the tylenol.     Advil up to 800mg  per dose every 8hrs as needed for pain.    PLEASE RECORD NUMBER OF PILLS TAKEN UNTIL NEXT FOLLOW UP APPT.  THIS WILL HELP DETERMINE HOW READY YOU ARE TO BE RELEASED FROM ANY ACTIVITY RESTRICTIONS  Do not drive or use heavy machinery while taking prescription pain medicine.  Do not drink alcohol while taking prescription pain medicine.  Bilateral Salpingo-Oophorectomy, Care After This sheet gives you information about how to care for yourself after your procedure. Your health care provider may also give you more specific instructions. If you have problems or questions, contact your health care provider. What can I expect after the procedure? After the procedure, it is common to have:  Abdominal pain.  Some occasional vaginal bleeding (spotting).  Tiredness.  Symptoms of menopause, such as  hot flashes, night sweats, or mood swings. Follow these instructions at home: Incision care   Keep your incision area and your bandage (dressing) clean and dry.  Follow instructions from your health care provider about how to take care of your incision. Make sure you: ? Wash your hands with soap and water before you change your dressing. If soap and water are not available, use hand sanitizer. ? Change your dressing as told by your health care provider. ? Leave stitches (sutures), staples, skin glue, or adhesive strips in place. These skin closures may need to stay in place for 2 weeks or longer. If adhesive strip edges start to loosen and curl up, you may trim the loose edges. Do not remove adhesive strips completely unless your health care provider tells you to do that.  Check your incision area every day for signs of infection. Check for: ? Redness, swelling, or pain. ? Fluid or blood. ? Warmth. ? Pus or a bad smell. Activity   Do not drive or use heavy machinery while taking prescription pain medicine.  Do not drive for 24 hours if you received a medicine to help you relax (sedative) during your procedure.  Take frequent, short walks throughout the day. Rest when you get tired. Ask your health care provider what activities are safe for you.  Avoid activity that requires great effort. Also, avoid heavy lifting. Do not lift anything that is heavier than 10 lbs. (4.5 kg), or the limit that your health care provider tells you, until he or she says that  it is safe to do so.  Do not douche, use tampons, or have sex until your health care provider approves. General instructions   To prevent or treat constipation while you are taking prescription pain medicine, your health care provider may recommend that you: ? Drink enough fluid to keep your urine clear or pale yellow. ? Take over-the-counter or prescription medicines. ? Eat foods that are high in fiber, such as fresh fruits and  vegetables, whole grains, and beans. ? Limit foods that are high in fat and processed sugars, such as fried and sweet foods.  Take over-the-counter and prescription medicines only as told by your health care provider.  Do not take baths, swim, or use a hot tub until your health care provider approves. Ask your health care provider if you can take showers. You may only be allowed to take sponge baths for bathing.  Wear compression stockings as told by your health care provider. These stockings help to prevent blood clots and reduce swelling in your legs.  Keep all follow-up visits as told by your health care provider. This is important. Contact a health care provider if:  You have pain when you urinate.  You have pus or a bad smelling discharge coming from your vagina.  You have redness, swelling, or pain around your incision.  You have fluid or blood coming from your incision.  Your incision feels warm to the touch.  You have pus or a bad smell coming from your incision.  You have a fever.  Your incision starts to break open.  You have pain in the abdomen, and it gets worse or does not get better when you take medicine.  You develop a rash.  You develop nausea and vomiting.  You feel lightheaded. Get help right away if:  You develop pain in your chest or leg.  You become short of breath.  You faint.  You have increased bleeding from your vagina. Summary  After the procedure, it is common to have pain, bleeding in the vagina, and symptoms of menopause.  Follow instructions from your health care provider about how to take care of your incision.  Follow instructions from your health care provider about activities and restrictions.  Check your incision every day for signs of infection and report any symptoms to your health care provider. This information is not intended to replace advice given to you by your health care provider. Make sure you discuss any questions  you have with your health care provider. Document Released: 04/10/2005 Document Revised: 06/14/2018 Document Reviewed: 05/15/2016 Elsevier Patient Education  2020 [employer].  Incision care     Follow instructions from your health care provider about how to take care of your incision areas. Make sure you: ? Keep your incisions clean and dry. ? Wash your hands with soap and water before and after applying medicine to the areas, and before and after changing your bandage (dressing). If soap and water are not available, use hand sanitizer. ? Change your dressing as told by your health care provider. ? Leave stitches (sutures), skin glue, or adhesive strips in place. These skin closures may need to stay in place for 2 weeks or longer. If adhesive strip edges start to loosen and curl up, you may trim the loose edges. Do not remove adhesive strips completely unless your health care provider tells you to do that.  Do not wear tight clothing over the incisions. Tight clothing may rub and irritate the incision areas, which  may cause the incisions to open.  Do not take baths, swim, or use a hot tub until your health care provider approves. OK TO SHOWER IN 24HRS.    Check your incision area every day for signs of infection. Check for: ? More redness, swelling, or pain. ? More fluid or blood. ? Warmth. ? Pus or a bad smell. Activity  Avoid lifting anything that is heavier than 10 lb (4.5 kg) for 2 weeks or until your health care provider says it is okay.  No pushing/pulling greater than 30lbs  You may resume normal activities as told by your health care provider. Ask your health care provider what activities are safe for you.  Take rest breaks during the day as needed. Eating and drinking  Follow instructions from your health care provider about what you can eat after surgery.  To prevent or treat constipation while you are taking prescription pain medicine, your health care provider may  recommend that you: ? Drink enough fluid to keep your urine clear or pale yellow. ? Take over-the-counter or prescription medicines. ? Eat foods that are high in fiber, such as fresh fruits and vegetables, whole grains, and beans. ? Limit foods that are high in fat and processed sugars, such as fried and sweet foods. General instructions  Ask your health care provider when you will need an appointment to get your sutures or staples removed.  Keep all follow-up visits as told by your health care provider. This is important. Contact a health care provider if:  You have more redness, swelling, or pain around your incisions.  You have more fluid or blood coming from the incisions.  Your incisions feel warm to the touch.  You have pus or a bad smell coming from your incisions or your dressing.  You have a fever.  You have an incision that breaks open (edges not staying together) after sutures or staples have been removed. Get help right away if:  You develop a rash.  You have chest pain or difficulty breathing.  You have pain or swelling in your legs.  You feel light-headed or you faint.  Your abdomen swells (becomes distended).  You have nausea or vomiting.  You have blood in your stool (feces). This information is not intended to replace advice given to you by your health care provider. Make sure you discuss any questions you have with your health care provider. Document Released: 10/28/2004 Document Revised: 12/28/2017 Document Reviewed: 01/10/2016 Elsevier Interactive Patient Education  2019 [employer]

## 2018-12-06 NOTE — Anesthesia Postprocedure Evaluation (Signed)
Anesthesia Post Note  Patient: Patricia Bernard  Procedure(s) Performed: XI ROBOTIC ASSISTED FEMORAL HERNIA REPAIR WITH MESH (N/A Abdomen) ROBOTIC ASSISTED SALPINGO OOPHORECTOMY (Right Abdomen)  Patient location during evaluation: PACU Anesthesia Type: General Level of consciousness: awake and alert Pain management: pain level controlled Vital Signs Assessment: post-procedure vital signs reviewed and stable Respiratory status: spontaneous breathing, nonlabored ventilation, respiratory function stable and patient connected to nasal cannula oxygen Cardiovascular status: blood pressure returned to baseline and stable Postop Assessment: no apparent nausea or vomiting Anesthetic complications: no     Last Vitals:  Vitals:   12/06/18 1850 12/06/18 1915  BP: (!) 130/58 (!) 146/56  Pulse: 65 68  Resp: 15   Temp: 37.4 C   SpO2: 96% 98%    Last Pain:  Vitals:   12/06/18 1915  TempSrc:   PainSc: 0-No pain                 Tarry Fountain S

## 2018-12-06 NOTE — Anesthesia Post-op Follow-up Note (Signed)
Anesthesia QCDR form completed.        

## 2018-12-06 NOTE — Op Note (Signed)
Preoperative diagnosis: right femoral Hernia, incarcerated  Postoperative diagnosis: right direct inguinal Hernia, incarcerated  Procedure: Robotic assisted laparoscopic right inguinal hernia repair with mesh Right salphingooophorectomy performed by Dr. Elesa MassedWard  Anesthesia: General  Surgeon: Dr. Tonna BoehringerSakai  Wound Classification: Clean  Specimen: none  Complications: None  Estimated Blood Loss: 20mL   Indications: Right inguinal hernia. Repair was indicated to avoid complications of incarceration, obstruction and pain, and a prosthetic mesh repair was elected.  See H&P for further details.  Findings: 1. Round ligament identified and preserved 2. Bard 3D max mesh used for repair 3. Adequate hemostasis achieved   Description of procedure: The patient was taken to the operating room. A time-out was completed verifying correct patient, procedure, site, positioning, and implant(s) and/or special equipment prior to beginning this procedure.  Right groin was prepped and draped in the usual sterile fashion. An incision was marked 20 cm above the pubic tubercle, slightly above the umbilicus  Foley catheter placed.  Palmer's point located due to hx of c-section and  Veress needle was inserted.  Saline drop test noted to be positive with gradual increase in pressure after initiation of gas insufflation.  15 mm of pressure was achieved prior to removing the Veress needle and then placing a 5 mm port via the Optiview technique through the previous needle insertion site.  Inspection of the area afterwards noted no injury to the surrounding organs during insertion of the needle and the Optiview port.  No adhesions noted within abdomen.  2 port sites were marked 8 cm to the lateral sides of the previously marked site, and a 8 mm robotic port was placed on the left and middle sites, 12 mm robotic port on the right side under direct supervision.  Local anesthesia  infused to the preplanned incision site prior to  insertion of the port, and exparel infused as an ilioinguinal block under direct visualization. The [employer] Vinci Xi platform was then brought into the operative field and docked to the ports.  Dr. Elesa MassedWard then performed the right salphingooophorectomy.  Please see her op note for details.  I resumed my portion below after she competed her portion.  Examination of the abdominal cavity noted a right direct inguinal hernia.  A peritoneal flap was created approximately 8cm cephalad to the defect by using scissors with electrocautery.  Dissection was carried down towards the pubic tubercle, developing the myopectineal orifice view.  Laterally the flap was carried towards the ASIS.  right hernia sac was noted with preperiotoneal fat within the defect, which was carefully dissected away from the adjacent tissues to be fully reduced out of hernia cavity.  Any bleeding was controlled with electrocautery.  After confirming adequate dissection and the peritoneal reflection completely down and away from the defect and round ligament, a Bard 3DMax large mesh was placed within the anterior abdominal wall, secured in place using 2-0 Vicryl on an SH needle immediately above the pubic tubercle.  After noting proper placement of the mesh with the peritoneal reflection deep to it, the previously created peritoneal flap was secured back up to the anterior abdominal wall using running 3-0 V-Lock on a noncutting needle. The small peritoneal defect created during reduction of sac was closed with v-lock as well.  One additional figure of eight used to close a far lateral defect.  No obvious bleeding noted at end or procedure after closure.  Needles were then removed out of the abdominal cavity, Xi platform undocked from the ports and removed off of  operative field.  The 12 mm port site closed with PMI device using 0 Vicryl, using the robotic camera.  Abdomen then desufflated under visualization and again, no obvious bleeding was noted and  remaining ports removed. All the skin incisions were then closed with a subcuticular stitch of Monocryl 4-0. Dermabond was applied. Foley catheter removed. The patient tolerated the procedure well and was taken to the postanesthesia care unit in stable condition. Sponge and instrument count correct at end of procedure.

## 2018-12-06 NOTE — Interval H&P Note (Signed)
History and Physical Interval Note:  12/06/2018 12:34 PM  Patricia Bernard  has presented today for surgery, with the diagnosis of K41.90 FEMORAL HERNIA WITH OUT OBSTRUCTION OR GANGRENE POSSIBLE BILATERAL OOPHORECTOMY.  The various methods of treatment have been discussed with the patient and family. After consideration of risks, benefits and other options for treatment, the patient has consented to  Procedure(s): XI ROBOTIC ASSISTED FEMORAL HERNIA REPAIR WITH MESH (N/A) LAPAROSCOPIC BILATERAL SALPINGO OOPHORECTOMY (Bilateral) as a surgical intervention.  The patient's history has been reviewed, patient examined, no change in status, stable for surgery.  I have reviewed the patient's chart and labs.  Questions were answered to the patient's satisfaction.     Patricia Bernard

## 2018-12-06 NOTE — Anesthesia Preprocedure Evaluation (Addendum)
Anesthesia Evaluation  Patient identified by MRN, date of birth, ID band Patient awake    Reviewed: Allergy & Precautions, NPO status , Patient's Chart, lab work & pertinent test results  History of Anesthesia Complications Negative for: history of anesthetic complications  Airway Mallampati: III  TM Distance: >3 FB Neck ROM: Full    Dental no notable dental hx.    Pulmonary neg pulmonary ROS, neg sleep apnea, neg COPD,    breath sounds clear to auscultation- rhonchi (-) wheezing      Cardiovascular Exercise Tolerance: Good (-) hypertension(-) CAD, (-) Past MI, (-) Cardiac Stents and (-) CABG  Rhythm:Regular Rate:Normal - Systolic murmurs and - Diastolic murmurs hyperlipidemia   Neuro/Psych neg Seizures negative neurological ROS  negative psych ROS   GI/Hepatic negative GI ROS, Neg liver ROS,   Endo/Other  negative endocrine ROSneg diabetes  Renal/GU negative Renal ROS     Musculoskeletal negative musculoskeletal ROS (+)   Abdominal (+) - obese,   Peds  Hematology negative hematology ROS (+)   Anesthesia Other Findings Past Medical History: No date: Complication of anesthesia     Comment:  Difficulty swallowing after neck surgery.   Reproductive/Obstetrics                            Anesthesia Physical Anesthesia Plan  ASA: II  Anesthesia Plan: General   Post-op Pain Management:    Induction: Intravenous  PONV Risk Score and Plan: 2 and Ondansetron, Dexamethasone and Midazolam  Airway Management Planned: Oral ETT  Additional Equipment:   Intra-op Plan:   Post-operative Plan: Extubation in OR  Informed Consent: I have reviewed the patients History and Physical, chart, labs and discussed the procedure including the risks, benefits and alternatives for the proposed anesthesia with the patient or authorized representative who has indicated his/her understanding and acceptance.      Dental advisory given  Plan Discussed with: CRNA and Anesthesiologist  Anesthesia Plan Comments:         Anesthesia Quick Evaluation

## 2018-12-07 NOTE — Op Note (Signed)
12/06/2018  PATIENT:  Patricia Bernard  68 y.o. female  PRE-OPERATIVE DIAGNOSIS:  K41.90 FEMORAL HERNIA WITH OUT OBSTRUCTION OR GANGRENE WITH POSSIBLE INVOLVEMENT OF RIGHT OVARY. RIGHT, POSSIBLY BILATERAL, SALPINGO-OOPHORECTOMY  POST-OPERATIVE DIAGNOSIS:  K41.90 FEMORAL HERNIA WITH OUT OBSTRUCTION OR GANGRENE RIGHT SALPINGO-OOPHORECTOMY  PROCEDURE:  Procedure(s): XI ROBOTIC ASSISTED FEMORAL HERNIA REPAIR WITH MESH (N/A) ROBOTIC ASSISTED SALPINGO OOPHORECTOMY (Right)  SURGEON:  Surgeon(s) and Role: Panel 1:    Tonna Boehringer* Sakai, Isami, DO - Primary Panel 2:    * Ward, Elenora Fenderhelsea C, MD - Primary  ANESTHESIA: GET  EBL:  Total I/O In: 1050 [I.V.:1050] Out: 355 [Urine:350; Blood:5]  DRAINS: foley to gravity   FINDINGS: normal upper abdomen, no scarring intra-abdominally, normal appearing uterus, bilateral tubes and ovaries.  Normal pelvis.  Adipose-containing right Inguinal hernia  SPECIMEN: right tube and ovary (see Dr. Geoffery LyonsSakai's note for other specimens)  DISPOSITION OF SPECIMEN:  To pathology  COMPLICATIONS: none apparent  PATIENT DISPOSITION:  VS stable to PACU    Indication for surgery: Patient had presented to Dr. Tonna BoehringerSakai with complaints of right sided groin bulge, and imaging could not rule out right adnexal involvement. Patricia Bernard was offered BSO, BS, RSO, or no removal of any organs if there were no involvement.  She initially requested removal of both ovaries and tubes, however at time of surgery requested the left tube/ovary to be left insitu unless concerning findings were seen intraoperatively.  She stated even if the right ovary and tube were uninvolved she wanted them removed.  This request was witnessed. Risks benefits and alternatives were reviewed and informed consent was obtained.   Procedure: The patient was brought to the OR and identified as Patricia Bernard.  She was given general anesthesia via endotracheal route. A foley catheter was placed.  Nasogastric tube was placed.  She  remained supine and prepped and draped in the usual sterile fashion.  A surgical time-out was called. The attention was turned to the abdomen. Dr. Tonna BoehringerSakai and I both performed the opening sequence and insertion of trochars/positioning and assembly of the robot.  A Palmer's point incision was made and the veress needle was inserted and a pneumoperitoneum was created. A 5mm trochar was inserted using the Optiview method, and there was no underlying scar tissue from previous surgeries.  Thus, a midline incision was made approximately 20 cm above the public bone. Three Robotic trochars were inserted atraumatically under visualization.  The patient was placed in trendelenburg, and the daVinci Xi robot was docked and monopolor scissors and  bipolar fenestrated grasper were employed.  The bowel was retracted out of the pelvis. The uterus and bilateral tubes and ovaries were identified and were uninvolved in the hernia, and were normal appearing. Right ureter was identified and distant to the IP ligament. Per patient's request, the right IP was retracted medially and then medial peritoneum was opened and the IP was skeletonized.  Using the fenestrated grasper, the IP was thrice cauterized and transected.  The pedicle was hemostatic. The mesovarium was divided along the round ligament and the utero-ovarian ligament and vessels were twice cauterized and transected.  The ovary and tube were placed in an endopouch an retraced from the abodminal cavity.  The surgical site was hemostatic.  The left ovary had some adhesions to the sigmoid, and these were divided with sparse cautery and blunt dissection.     After this, Dr. Tonna BoehringerSakai started his portion of the case. Please see his op note for further details, including closure  and counts.   I assisted for a portion of his case, and then excused myself once the mesh was being placed.    Larey Days, MD Attending Obstetrician and Gynecologist [facility]

## 2018-12-08 ENCOUNTER — Encounter: Payer: Self-pay | Admitting: Surgery

## 2018-12-09 ENCOUNTER — Encounter: Payer: Self-pay | Admitting: Surgery

## 2018-12-09 NOTE — Progress Notes (Signed)
Patient states she should have stayed overnight and that the fee is too much.

## 2018-12-10 LAB — SURGICAL PATHOLOGY

## 2018-12-19 ENCOUNTER — Encounter: Payer: Self-pay | Admitting: Surgery

## 2019-05-30 ENCOUNTER — Other Ambulatory Visit: Payer: Self-pay

## 2019-05-30 ENCOUNTER — Ambulatory Visit: Payer: Medicare PPO | Attending: Internal Medicine

## 2019-05-30 DIAGNOSIS — Z23 Encounter for immunization: Secondary | ICD-10-CM | POA: Insufficient documentation

## 2019-05-30 NOTE — Progress Notes (Signed)
   Covid-19 Vaccination Clinic  Name:  Patricia Bernard    MRN: [id number] DOB: February 12, 1951  05/30/2019  [name] was observed post Covid-19 immunization for 15 minutes without incidence. She was provided with Vaccine Information Sheet and instruction to access the V-Safe system.   [name] was instructed to call 911 with any severe reactions post vaccine: Marland Kitchen Difficulty breathing  . Swelling of your face and throat  . A fast heartbeat  . A bad rash all over your body  . Dizziness and weakness    Immunizations Administered    Name Date Dose VIS Date Route   Moderna COVID-19 Vaccine 05/30/2019  3:44 PM 0.5 mL 03/25/2019 Intramuscular   Manufacturer: Moderna   Lot: [id number]   NDC: 37106-269-48

## 2019-07-01 ENCOUNTER — Ambulatory Visit: Payer: Medicare PPO | Attending: Internal Medicine

## 2019-07-01 DIAGNOSIS — Z23 Encounter for immunization: Secondary | ICD-10-CM | POA: Insufficient documentation

## 2019-07-01 NOTE — Progress Notes (Signed)
   Covid-19 Vaccination Clinic  Name:  Patricia Bernard    MRN: [id number] DOB: 07-21-1950  07/01/2019  Patricia Bernard was observed post Covid-19 immunization for 15 minutes without incident. She was provided with Vaccine Information Sheet and instruction to access the V-Safe system.   Patricia Bernard was instructed to call 911 with any severe reactions post vaccine: Marland Kitchen Difficulty breathing  . Swelling of face and throat  . A fast heartbeat  . A bad rash all over body  . Dizziness and weakness   Immunizations Administered    Name Date Dose VIS Date Route   Moderna COVID-19 Vaccine 07/01/2019  3:16 PM 0.5 mL 03/25/2019 Intramuscular   Manufacturer: Moderna   Lot: [id number]   NDC: 62824-175-30

## 2019-09-30 ENCOUNTER — Other Ambulatory Visit: Payer: Self-pay | Admitting: Nurse Practitioner

## 2019-09-30 DIAGNOSIS — Z1231 Encounter for screening mammogram for malignant neoplasm of breast: Secondary | ICD-10-CM

## 2019-11-23 ENCOUNTER — Encounter: Payer: Self-pay | Admitting: Emergency Medicine

## 2019-11-23 ENCOUNTER — Other Ambulatory Visit: Payer: Self-pay

## 2019-11-23 ENCOUNTER — Ambulatory Visit
Admission: EM | Admit: 2019-11-23 | Discharge: 2019-11-23 | Disposition: A | Discharge status: Home / Self Care | Payer: Medicare PPO | Attending: Family Medicine | Admitting: Family Medicine

## 2019-11-23 DIAGNOSIS — N3001 Acute cystitis with hematuria: Secondary | ICD-10-CM | POA: Diagnosis present

## 2019-11-23 LAB — URINALYSIS, COMPLETE (UACMP) WITH MICROSCOPIC
Bilirubin Urine: NEGATIVE
Glucose, UA: NEGATIVE mg/dL
Ketones, ur: NEGATIVE mg/dL
Nitrite: NEGATIVE
Protein, ur: NEGATIVE mg/dL
Specific Gravity, Urine: 1.015 (ref 1.005–1.030)
WBC, UA: >50 WBC/hpf (ref 0–5)
pH: 7 (ref 5.0–8.0)

## 2019-11-23 MED ORDER — CEPHALEXIN 500 MG PO CAPS: 500.0000 mg | ORAL_CAPSULE | Freq: Two times a day (BID) | ORAL | 0 refills | Status: DC

## 2019-11-23 NOTE — ED Provider Notes (Signed)
[facility]    CSN: [id number] Arrival date & time: 11/23/19  0093      History   Chief Complaint Chief Complaint  Patient presents with  . Urinary Urgency  . Dysuria    HPI  69 year old female presents with suspected UTI.  Patient reports that her symptoms started abruptly this morning.  She reports dysuria, urinary urgency, and hematuria.  She reports some discomfort in the lower abdominal region.  No flank pain.  No back pain.  Rates her pain as 8/10 in severity.  No medications or interventions tried.  No relieving factors.  No otherwise.  Past Medical History:  Diagnosis Date  . Complication of anesthesia    Difficulty swallowing after surgery.   Past Surgical History:  Procedure Laterality Date  . CESAREAN SECTION  1984  . ROBOTIC ASSISTED SALPINGO OOPHERECTOMY Right 12/06/2018   Procedure: ROBOTIC ASSISTED SALPINGO OOPHORECTOMY;  Surgeon: Ward, Elenora Fender, MD;  Location: [facility];  Service: Gynecology;  Laterality: Right;  . SUBMANDIBULAR GLAND EXCISION  2014  . XI ROBOTIC ASSISTED INGUINAL HERNIA REPAIR WITH MESH N/A 12/06/2018   Procedure: XI ROBOTIC ASSISTED FEMORAL HERNIA REPAIR WITH MESH;  Surgeon: Sung Amabile, DO;  Location: [facility];  Service: General;  Laterality: N/A;    OB History   No obstetric history on file.      Home Medications    Prior to Admission medications   Medication Sig Start Date End Date Taking? Authorizing Provider  aspirin EC 81 MG tablet Take 81 mg by mouth daily.   Yes [provider]  Garlic 10 MG CAPS Take 10 mg by mouth daily.   Yes [provider]  magnesium oxide (MAG-OX) 400 MG tablet Take 400 mg by mouth daily.   Yes [provider]  Omega-3 Fatty Acids (FISH OIL) 1000 MG CAPS Take 1,000 mg by mouth daily.   Yes [provider]  rosuvastatin (CRESTOR) 5 MG tablet Take 5 mg by mouth daily.   Yes [provider]  vitamin C (ASCORBIC ACID) 500 MG tablet Take 500 mg by  mouth daily.   Yes [provider]  cephALEXin (KEFLEX) 500 MG capsule Take 1 capsule (500 mg total) by mouth 2 (two) times daily. 11/23/19   Tommie Sams, DO  ibuprofen (ADVIL) 800 MG tablet Take 1 tablet (800 mg total) by mouth every 8 (eight) hours as needed for mild pain or moderate pain. 12/06/18   Sung Amabile, DO    Family History Family History  Problem Relation Age of Onset  . Breast cancer Neg Hx     Social History Social History   Tobacco Use  . Smoking status: Never Smoker  . Smokeless tobacco: Never Used  Vaping Use  . Vaping Use: Never used  Substance Use Topics  . Alcohol use: Yes    Comment: Occasional  . Drug use: Not Currently     Allergies   Boniva [ibandronic acid]   Review of Systems Review of Systems  Constitutional: Negative for fever.  Genitourinary: Positive for dysuria, hematuria and urgency. Negative for flank pain.   Physical Exam Triage Vital Signs ED Triage Vitals  Enc Vitals Group     BP 11/23/19 0841 (!) 138/62     Pulse Rate 11/23/19 0841 77     Resp 11/23/19 0841 14     Temp 11/23/19 0841 98.2 F (36.8 C)     Temp Source 11/23/19 0841 Oral     SpO2 11/23/19 0841 98 %  Weight 11/23/19 0837 109 lb (49.4 kg)     Height 11/23/19 0837 5\' 1"  (1.549 m)     Head Circumference --      Peak Flow --      Pain Score 11/23/19 0837 8     Pain Loc --      Pain Edu? --      Excl. in GC? --    Updated Vital Signs BP (!) 138/62 (BP Location: Left Arm)   Pulse 77   Temp 98.2 F (36.8 C) (Oral)   Resp 14   Ht 5\' 1"  (1.549 m)   Wt 49.4 kg   SpO2 98%   BMI 20.60 kg/m   Visual Acuity Right Eye Distance:   Left Eye Distance:   Bilateral Distance:    Right Eye Near:   Left Eye Near:    Bilateral Near:     Physical Exam Vitals and nursing note reviewed.  Constitutional:      Appearance: Normal appearance.  HENT:     Head: Normocephalic and atraumatic.  Cardiovascular:     Rate and Rhythm: Normal rate and regular  rhythm.     Heart sounds: No murmur heard.   Pulmonary:     Effort: Pulmonary effort is normal.     Breath sounds: Normal breath sounds. No wheezing, rhonchi or rales.  Abdominal:     General: There is no distension.     Palpations: Abdomen is soft.     Tenderness: There is no abdominal tenderness.  Neurological:     Mental Status: She is alert.  Psychiatric:        Mood and Affect: Mood normal.        Behavior: Behavior normal.    UC Treatments / Results  Labs (all labs ordered are listed, but only abnormal results are displayed) Labs Reviewed  URINALYSIS, COMPLETE (UACMP) WITH MICROSCOPIC - Abnormal; Notable for the following components:      Result Value   APPearance CLOUDY (*)    Hgb urine dipstick LARGE (*)    Leukocytes,Ua MODERATE (*)    Bacteria, UA FEW (*)    All other components within normal limits  URINE CULTURE    EKG   Radiology No results found.  Procedures Procedures (including critical care time)  Medications Ordered in UC Medications - No data to display  Initial Impression / Assessment and Plan / UC Course  I have reviewed the triage vital signs and the nursing notes.  Pertinent labs & imaging results that were available during my care of the patient were reviewed by me and considered in my medical decision making (see chart for details).    69 year old female presents with UTI.  Sending culture.  Treating with Keflex.  Final Clinical Impressions(s) / UC Diagnoses   Final diagnoses:  Acute cystitis with hematuria     Discharge Instructions     Antibiotic as prescribed.  Take care  Dr.    ED Prescriptions    Medication Sig Dispense Auth. Provider   cephALEXin (KEFLEX) 500 MG capsule Take 1 capsule (500 mg total) by mouth 2 (two) times daily. 14 capsule 66 G, DO     PDMP not reviewed this encounter.   Adriana Simas [place], [place] Other 11/23/19 [phone]

## 2019-11-23 NOTE — ED Triage Notes (Signed)
Patient c/o burning when urinating and urinary urgency that started early in the morning today.

## 2019-11-23 NOTE — Discharge Instructions (Signed)
Antibiotic as prescribed.  Take care  Dr. Aerika Groll  

## 2019-11-25 LAB — URINE CULTURE: Culture: >=100000 — AB

## 2019-12-01 ENCOUNTER — Ambulatory Visit
Admission: RE | Admit: 2019-12-01 | Discharge: 2019-12-01 | Disposition: A | Discharge status: Home / Self Care | Payer: Medicare PPO | Source: Ambulatory Visit | Attending: Nurse Practitioner | Admitting: Nurse Practitioner

## 2019-12-01 ENCOUNTER — Other Ambulatory Visit: Payer: Self-pay

## 2019-12-01 DIAGNOSIS — Z1231 Encounter for screening mammogram for malignant neoplasm of breast: Secondary | ICD-10-CM | POA: Diagnosis present

## 2020-01-07 ENCOUNTER — Other Ambulatory Visit: Payer: Self-pay

## 2020-01-07 ENCOUNTER — Ambulatory Visit
Admission: EM | Admit: 2020-01-07 | Discharge: 2020-01-07 | Disposition: A | Discharge status: Home / Self Care | Payer: Medicare PPO | Attending: Emergency Medicine | Admitting: Emergency Medicine

## 2020-01-07 DIAGNOSIS — Z20822 Contact with and (suspected) exposure to covid-19: Secondary | ICD-10-CM

## 2020-01-07 NOTE — ED Triage Notes (Signed)
Patient needs COVID testing, no symptoms, no exposure.

## 2020-01-07 NOTE — Discharge Instructions (Signed)

## 2020-01-08 LAB — SARS CORONAVIRUS 2 (TAT 6-24 HRS): SARS Coronavirus 2: NEGATIVE

## 2020-09-24 ENCOUNTER — Other Ambulatory Visit: Payer: Self-pay

## 2020-09-24 ENCOUNTER — Ambulatory Visit
Admission: EM | Admit: 2020-09-24 | Discharge: 2020-09-24 | Disposition: A | Discharge status: Home / Self Care | Payer: Medicare PPO | Attending: Emergency Medicine | Admitting: Emergency Medicine

## 2020-09-24 ENCOUNTER — Encounter: Payer: Self-pay | Admitting: Emergency Medicine

## 2020-09-24 DIAGNOSIS — H66003 Acute suppurative otitis media without spontaneous rupture of ear drum, bilateral: Secondary | ICD-10-CM | POA: Insufficient documentation

## 2020-09-24 DIAGNOSIS — J029 Acute pharyngitis, unspecified: Secondary | ICD-10-CM | POA: Diagnosis present

## 2020-09-24 LAB — GROUP A STREP BY PCR: Group A Strep by PCR: NOT DETECTED

## 2020-09-24 MED ORDER — AMOXICILLIN-POT CLAVULANATE 875-125 MG PO TABS: 1.0000 | ORAL_TABLET | Freq: Two times a day (BID) | ORAL | 0 refills | Status: AC

## 2020-09-24 NOTE — Discharge Instructions (Signed)
Complete course of antibiotics.  Push fluids to ensure adequate hydration and keep secretions thin.  Tylenol and/or ibuprofen as needed for pain or fevers.  If symptoms worsen or do not improve in the next week to return to be seen or to follow up with your PCP.

## 2020-09-24 NOTE — ED Provider Notes (Signed)
[facility]    CSN: [id number] Arrival date & time: 09/24/20  4270      History   Chief Complaint Chief Complaint  Patient presents with  . Otalgia  . Sore Throat    HPI Patricia Bernard is a 70 y.o. female.   Patricia Bernard presents with complaints of laryngitis, sore throat, and bilateral ear pain which started around 4 days ago. She feels that her voice has improved but still with sore throat and ear pain. No known fevers. No gi symptoms. No known ill contacts. Has had similar in the past, typically requires antibiotics. No previous tonsillectomy. No difficulty swallowing. No cough or nasal drainage.     ROS per HPI, negative if not otherwise mentioned.      Past Medical History:  Diagnosis Date  . Complication of anesthesia    Difficulty swallowing after surgery.    There are no problems to display for this patient.   Past Surgical History:  Procedure Laterality Date  . CESAREAN SECTION  1984  . ROBOTIC ASSISTED SALPINGO OOPHERECTOMY Right 12/06/2018   Procedure: ROBOTIC ASSISTED SALPINGO OOPHORECTOMY;  Surgeon: Ward, Elenora Fender, MD;  Location: [facility];  Service: Gynecology;  Laterality: Right;  . SUBMANDIBULAR GLAND EXCISION  2014  . XI ROBOTIC ASSISTED INGUINAL HERNIA REPAIR WITH MESH N/A 12/06/2018   Procedure: XI ROBOTIC ASSISTED FEMORAL HERNIA REPAIR WITH MESH;  Surgeon: Sung Amabile, DO;  Location: [facility];  Service: General;  Laterality: N/A;    OB History   No obstetric history on file.      Home Medications    Prior to Admission medications   Medication Sig Start Date End Date Taking? Authorizing Provider  amoxicillin-clavulanate (AUGMENTIN) 875-125 MG tablet Take 1 tablet by mouth every 12 (twelve) hours. 09/24/20  Yes Linus Mako B, NP  aspirin EC 81 MG tablet Take 81 mg by mouth daily.   Yes [provider]  Garlic 10 MG CAPS Take 10 mg by mouth daily.   Yes [provider]  ibuprofen (ADVIL) 800 MG tablet Take 1  tablet (800 mg total) by mouth every 8 (eight) hours as needed for mild pain or moderate pain. 12/06/18  Yes Sakai, Isami, DO  magnesium oxide (MAG-OX) 400 MG tablet Take 400 mg by mouth daily.   Yes [provider]  Omega-3 Fatty Acids (FISH OIL) 1000 MG CAPS Take 1,000 mg by mouth daily.   Yes [provider]  rosuvastatin (CRESTOR) 5 MG tablet Take 5 mg by mouth daily.   Yes [provider]  vitamin C (ASCORBIC ACID) 500 MG tablet Take 500 mg by mouth daily.   Yes [provider]    Family History Family History  Problem Relation Age of Onset  . Breast cancer Neg Hx     Social History Social History   Tobacco Use  . Smoking status: Never Smoker  . Smokeless tobacco: Never Used  Vaping Use  . Vaping Use: Never used  Substance Use Topics  . Alcohol use: Yes    Comment: Occasional  . Drug use: Not Currently     Allergies   Boniva [ibandronic acid]   Review of Systems Review of Systems   Physical Exam Triage Vital Signs ED Triage Vitals  Enc Vitals Group     BP 09/24/20 0940 (!) 145/69     Pulse Rate 09/24/20 0940 66     Resp 09/24/20 0940 14     Temp 09/24/20 0940 98.2 F (36.8  C)     Temp Source 09/24/20 0940 Oral     SpO2 09/24/20 0940 99 %     Weight 09/24/20 0937 108 lb 14.5 oz (49.4 kg)     Height 09/24/20 0937 5\' 1"  (1.549 m)     Head Circumference --      Peak Flow --      Pain Score 09/24/20 0936 4     Pain Loc --      Pain Edu? --      Excl. in GC? --    No data found.  Updated Vital Signs BP (!) 145/69 (BP Location: Left Arm)   Pulse 66   Temp 98.2 F (36.8 C) (Oral)   Resp 14   Ht 5\' 1"  (1.549 m)   Wt 108 lb 14.5 oz (49.4 kg)   SpO2 99%   BMI 20.58 kg/m   Visual Acuity Right Eye Distance:   Left Eye Distance:   Bilateral Distance:    Right Eye Near:   Left Eye Near:    Bilateral Near:     Physical Exam Constitutional:      General: She is not in acute distress.    Appearance: She is  well-developed.  HENT:     Right Ear: A middle ear effusion is present. Tympanic membrane is erythematous.     Left Ear: A middle ear effusion is present. Tympanic membrane is erythematous.     Mouth/Throat:     Tonsils: No tonsillar exudate. 1+ on the right. 1+ on the left.  Cardiovascular:     Rate and Rhythm: Normal rate.  Pulmonary:     Effort: Pulmonary effort is normal.  Skin:    General: Skin is warm and dry.  Neurological:     Mental Status: She is alert and oriented to person, place, and time.      UC Treatments / Results  Labs (all labs ordered are listed, but only abnormal results are displayed) Labs Reviewed  GROUP A STREP BY PCR    EKG   Radiology No results found.  Procedures Procedures (including critical care time)  Medications Ordered in UC Medications - No data to display  Initial Impression / Assessment and Plan / UC Course  I have reviewed the triage vital signs and the nursing notes.  Pertinent labs & imaging results that were available during my care of the patient were reviewed by me and considered in my medical decision making (see chart for details).     Bilateral AOM on exam with antibiotics provided. Return precautions provided. Patient verbalized understanding and agreeable to plan.   Final Clinical Impressions(s) / UC Diagnoses   Final diagnoses:  Acute suppurative otitis media of both ears without spontaneous rupture of tympanic membranes, recurrence not specified  Acute pharyngitis, unspecified etiology     Discharge Instructions     Complete course of antibiotics.  Push fluids to ensure adequate hydration and keep secretions thin.  Tylenol and/or ibuprofen as needed for pain or fevers.  If symptoms worsen or do not improve in the next week to return to be seen or to follow up with your PCP.     ED Prescriptions    Medication Sig Dispense Auth. Provider   amoxicillin-clavulanate (AUGMENTIN) 875-125 MG tablet Take 1 tablet  by mouth every 12 (twelve) hours. 14 tablet 11/24/20, NP     PDMP not reviewed this encounter.   , NP 09/24/20 1047

## 2020-09-24 NOTE — ED Triage Notes (Signed)
Patient c/o sore throat that started on Tuesday.  Patient reports pressure in her head and ears.  Patient denies fevers.

## 2021-05-28 IMAGING — MG DIGITAL SCREENING BILAT W/ TOMO W/ CAD
8 series · 9 of 24 positions shown · non-contrast
Comparison: Previous exam(s).

CLINICAL DATA: Screening.

EXAM:
DIGITAL SCREENING BILATERAL MAMMOGRAM WITH TOMO AND CAD

[R MLO synth-2D]
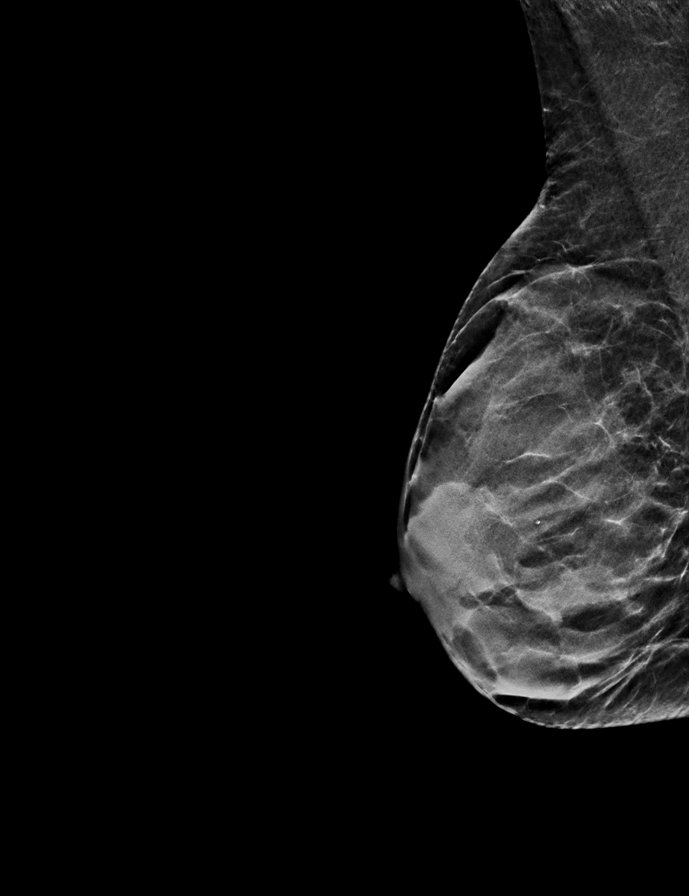

[L MLO synth-2D]
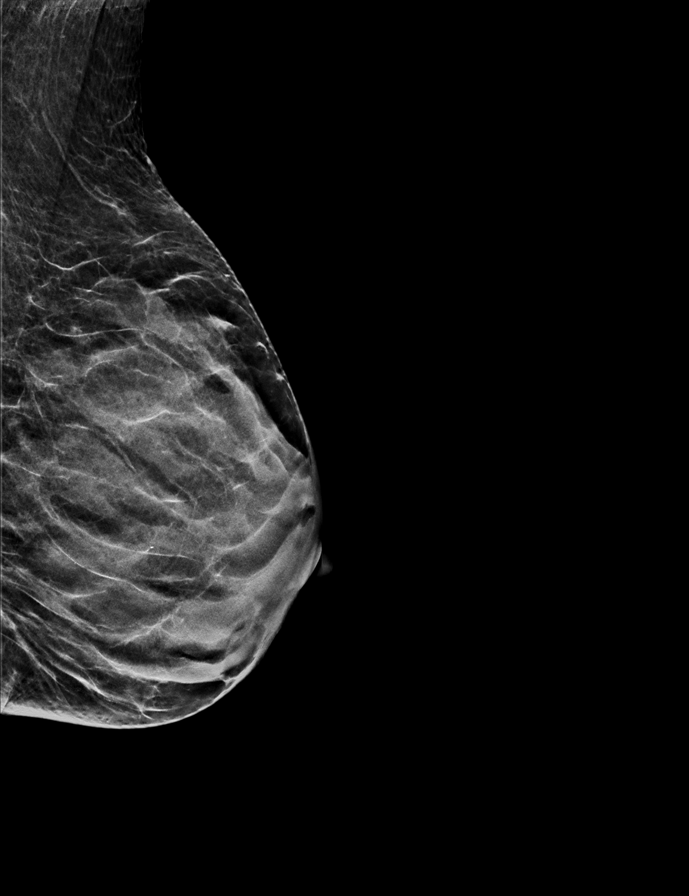

[L CC synth-2D]
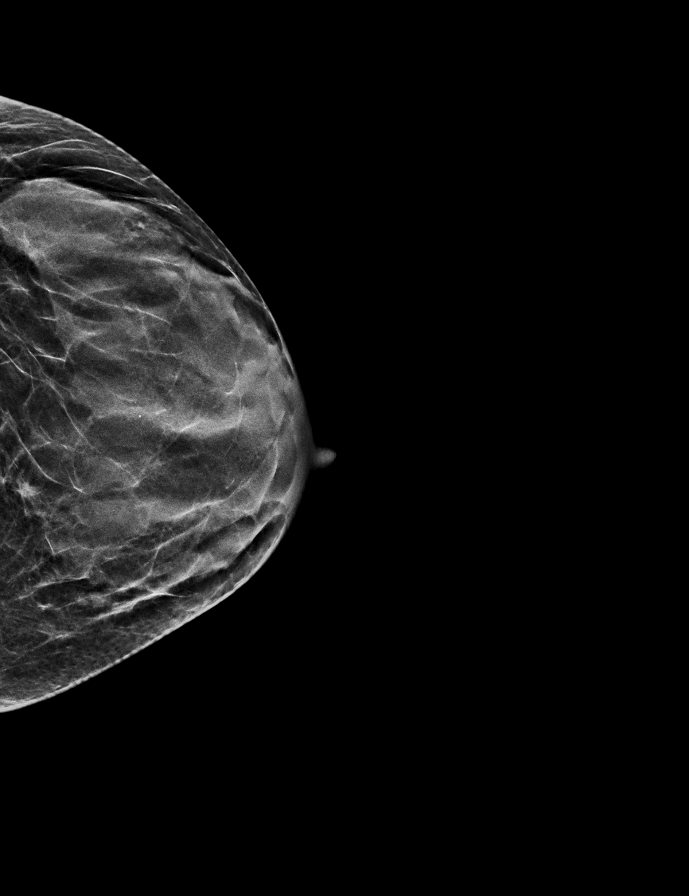

[R CC synth-2D]
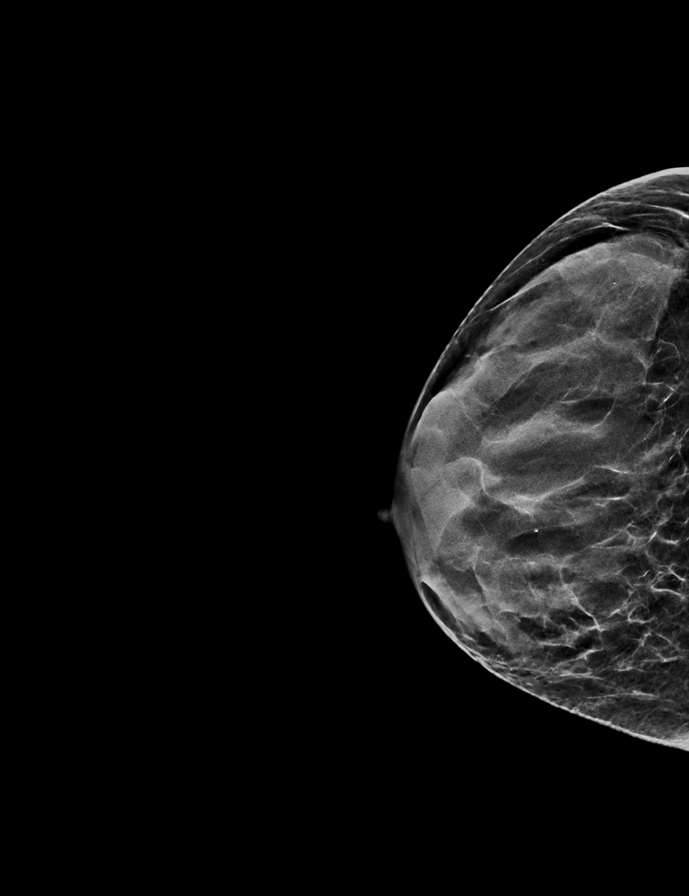

[L MLO tomo · 2 of 38 frames shown]
[frame 13/38]
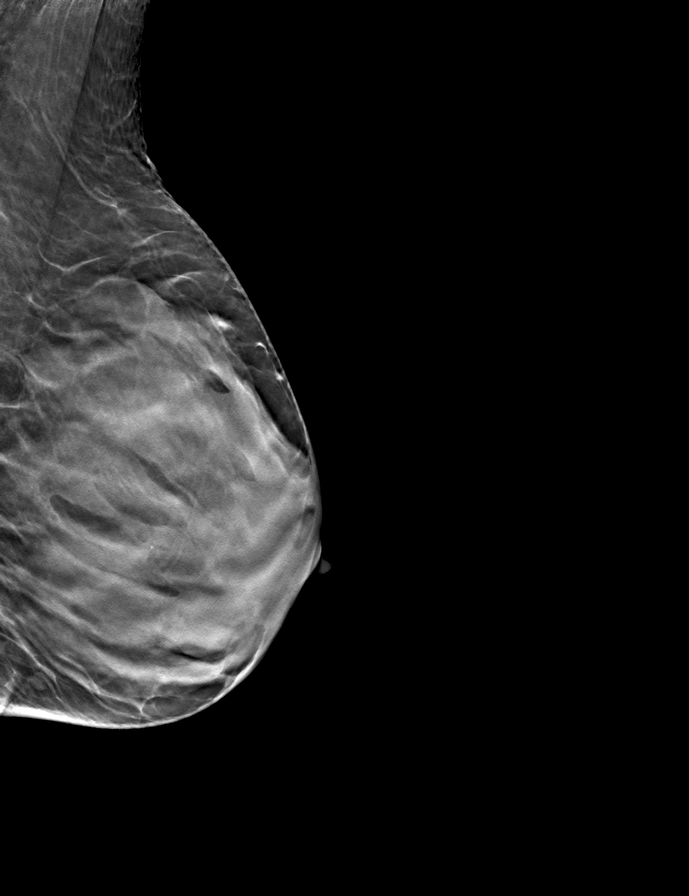
[frame 19/38]
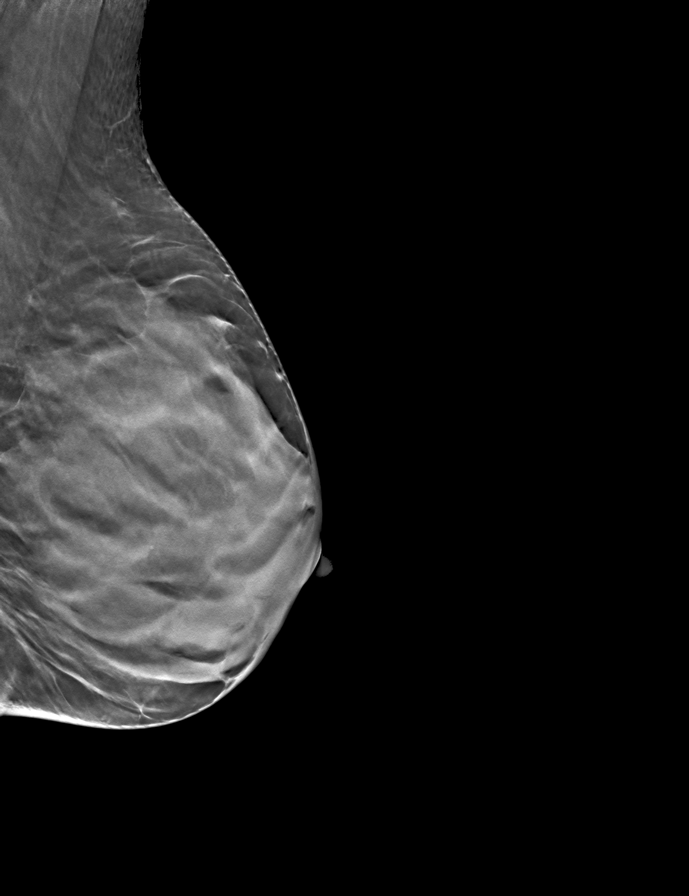

[L CC tomo · tomo slice 20/39.0]
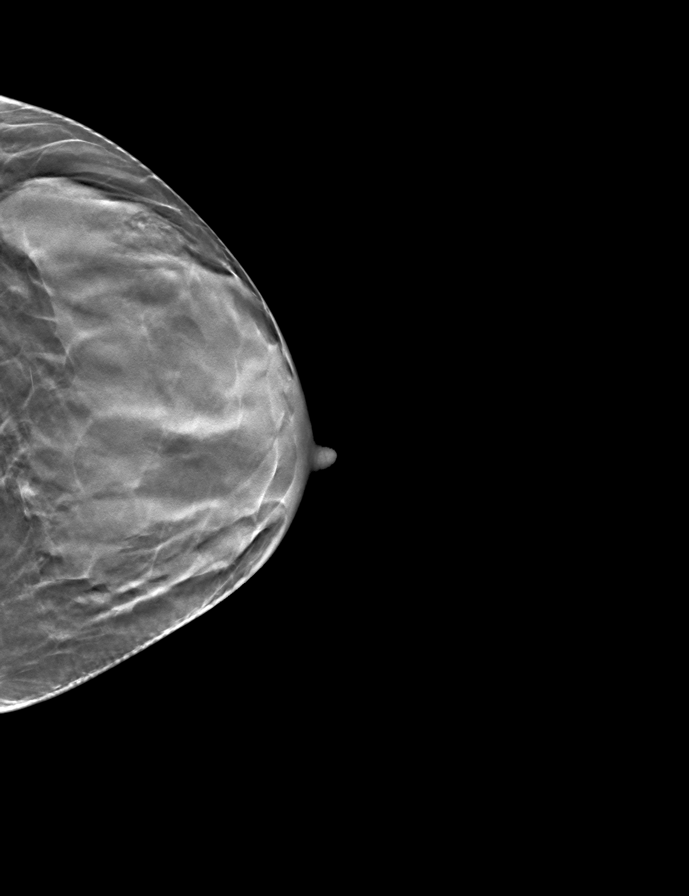

[R CC tomo · tomo slice 19/38.0]
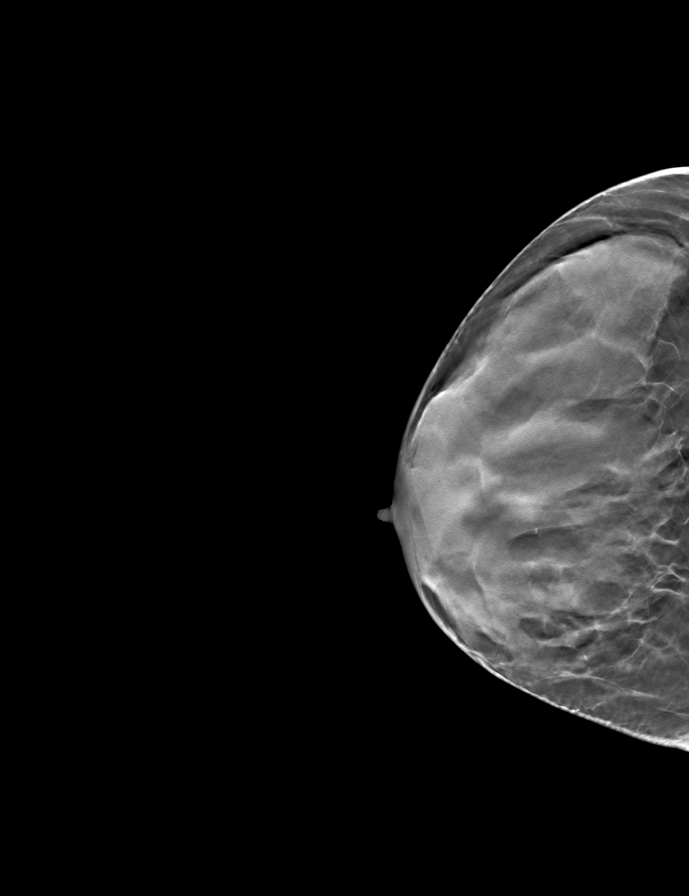

[R MLO tomo · tomo slice 19/37.0]
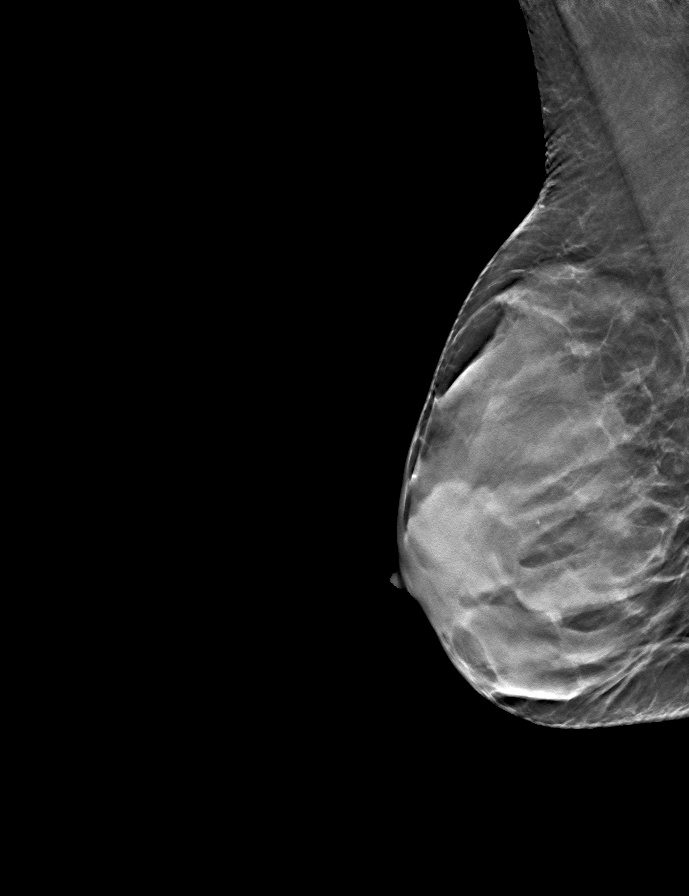

[9 of 24 positions shown; findings below may reference images not displayed]

ACR Breast Density Category d: The breast tissue is extremely dense,
which lowers the sensitivity of mammography
FINDINGS: There are no findings suspicious for malignancy. Images were
processed with CAD.
IMPRESSION: No mammographic evidence of malignancy. A result letter of this
screening mammogram will be mailed directly to the patient.

RECOMMENDATION:
Screening mammogram in one year. (Code:WO-0-ZI0)

BI-RADS CATEGORY  1: Negative.

## 2021-07-18 ENCOUNTER — Ambulatory Visit
Admission: EM | Admit: 2021-07-18 | Discharge: 2021-07-18 | Disposition: A | Discharge status: Home / Self Care | Payer: Medicare PPO | Attending: Emergency Medicine | Admitting: Emergency Medicine

## 2021-07-18 ENCOUNTER — Other Ambulatory Visit: Payer: Self-pay

## 2021-07-18 ENCOUNTER — Encounter: Payer: Self-pay | Admitting: Emergency Medicine

## 2021-07-18 DIAGNOSIS — J029 Acute pharyngitis, unspecified: Secondary | ICD-10-CM | POA: Diagnosis not present

## 2021-07-18 LAB — GROUP A STREP BY PCR: Group A Strep by PCR: NOT DETECTED

## 2021-07-18 MED ORDER — LIDOCAINE VISCOUS HCL 2 % MT SOLN: 15.0000 mL | OROMUCOSAL | 0 refills | Status: AC | PRN

## 2021-07-18 MED ORDER — IBUPROFEN 800 MG PO TABS: 800.0000 mg | ORAL_TABLET | Freq: Three times a day (TID) | ORAL | 0 refills | Status: AC

## 2021-07-18 NOTE — Discharge Instructions (Signed)
Your strep test today was negative for bacteria therefore your symptoms today are being caused by a virus meaning that they must resolve on its own ? ?You may attempt use of salt water gargles, warm liquids, teaspoons of honey for additional comfort ? ?You may attempt to gargle the over-the-counter Chloraseptic spray instead of spraying it which tends to be more effective ? ?You may use 600 to 800 mg of ibuprofen every 6-8 hours for pain management ? ?You may follow-up with urgent care as needed ?

## 2021-07-18 NOTE — ED Provider Notes (Signed)
?[place] ? ? ? ?CSN: [id number] ?Arrival date & time: 07/18/21  1604 ? ? ?  ? ?History   ?Chief Complaint ?Chief Complaint  ?Patient presents with  ? Sore Throat  ? ? ?HPI ?Patricia Bernard is a 71 y.o. female.  ? ?Patient presents with hoarseness and a sore throat for 6 days.  Hoarseness has resolved.  Painful to swallow.  Making it difficult for her to tolerate food or liquids.  Has attempted use of salt water rinses, DayQuil and equate cold and cough which have not been effective.  Denies fever, chills, body aches, congestion, ear pain, headaches, cough, shortness of breath, wheezing.   ? ?Past Medical History:  ?Diagnosis Date  ? Complication of anesthesia   ? Difficulty swallowing after surgery.  ? ? ?There are no problems to display for this patient. ? ? ?Past Surgical History:  ?Procedure Laterality Date  ? [place]  ? ROBOTIC ASSISTED SALPINGO OOPHERECTOMY Right 12/06/2018  ? Procedure: ROBOTIC ASSISTED SALPINGO OOPHORECTOMY;  Surgeon: Ward, Honor Loh, MD;  Location: [facility];  Service: Gynecology;  Laterality: Right;  ? [place] EXCISION  2014  ? XI ROBOTIC ASSISTED INGUINAL HERNIA REPAIR WITH MESH N/A 12/06/2018  ? Procedure: XI ROBOTIC ASSISTED FEMORAL HERNIA REPAIR WITH MESH;  Surgeon: Benjamine Sprague, DO;  Location: [facility];  Service: General;  Laterality: N/A;  ? ? ?OB History   ?No obstetric history on file. ?  ? ? ? ?Home Medications   ? ?Prior to Admission medications   ?Medication Sig Start Date End Date Taking? Authorizing Provider  ?amoxicillin-clavulanate (AUGMENTIN) 875-125 MG tablet Take 1 tablet by mouth every 12 (twelve) hours. 09/24/20   Zigmund Gottron, NP  ?aspirin EC 81 MG tablet Take 81 mg by mouth daily.    [provider]  ?Garlic 10 MG CAPS Take 10 mg by mouth daily.    [provider]  ?ibuprofen (ADVIL) 800 MG tablet Take 1 tablet (800 mg total) by mouth every 8 (eight) hours as needed for mild pain or moderate pain. 12/06/18   Benjamine Sprague, DO  ?magnesium oxide (MAG-OX) 400 MG tablet Take 400 mg by mouth daily.    [provider]  ?Omega-3 Fatty Acids (FISH OIL) 1000 MG CAPS Take 1,000 mg by mouth daily.    [provider]  ?rosuvastatin (CRESTOR) 5 MG tablet Take 5 mg by mouth daily.    [provider]  ?vitamin C (ASCORBIC ACID) 500 MG tablet Take 500 mg by mouth daily.    [provider]  ? ? ?Family History ?Family History  ?Problem Relation Age of Onset  ? Breast cancer Neg Hx   ? ? ?Social History ?Social History  ? ?Tobacco Use  ? Smoking status: Never  ? Smokeless tobacco: Never  ?Vaping Use  ? Vaping Use: Never used  ?Substance Use Topics  ? Alcohol use: Yes  ?  Comment: Occasional  ? Drug use: Not Currently  ? ? ? ?Allergies   ?Boniva [ibandronic acid] ? ? ?Review of Systems ?Review of Systems  ?Constitutional: Negative.   ?HENT:  Positive for sore throat and voice change. Negative for congestion, dental problem, drooling, ear discharge, ear pain, facial swelling, hearing loss, mouth sores, nosebleeds, postnasal drip, rhinorrhea, sinus pressure, sinus pain, sneezing, tinnitus and trouble swallowing.   ?Respiratory: Negative.    ?Cardiovascular: Negative.   ?Gastrointestinal: Negative.   ?Musculoskeletal:  Positive for myalgias. Negative for arthralgias, back pain, gait problem,  joint swelling, neck pain and neck stiffness.  ?Skin: Negative.   ? ? ?Physical Exam ?Triage Vital Signs ?ED Triage Vitals  ?Enc Vitals Group  ?   BP 07/18/21 1642 (!) 149/54  ?   Pulse Rate 07/18/21 1642 65  ?   Resp 07/18/21 1642 17  ?   Temp 07/18/21 1642 98.4 ?F (36.9 ?C)  ?   Temp Source 07/18/21 1642 Oral  ?   SpO2 07/18/21 1642 97 %  ?   Weight 07/18/21 1641 108 lb 14.5 oz (49.4 kg)  ?   Height 07/18/21 1641 5\' 1"  (1.549 m)  ?   Head Circumference --   ?   Peak Flow --   ?   Pain Score 07/18/21 1640 6  ?   Pain Loc --   ?   Pain Edu? --   ?   Excl. in [place]? --   ? ?No data found. ? ?Updated Vital Signs ?BP (!) 149/54 (BP  Location: Right Arm)   Pulse 65   Temp 98.4 ?F (36.9 ?C) (Oral)   Resp 17   Ht 5\' 1"  (1.549 m)   Wt 108 lb 14.5 oz (49.4 kg)   SpO2 97%   BMI 20.58 kg/m?  ? ?Visual Acuity ?Right Eye Distance:   ?Left Eye Distance:   ?Bilateral Distance:   ? ?Right Eye Near:   ?Left Eye Near:    ?Bilateral Near:    ? ?Physical Exam ?Constitutional:   ?   Appearance: She is well-developed.  ?HENT:  ?   Head: Normocephalic.  ?   Right Ear: Tympanic membrane and ear canal normal.  ?   Left Ear: Tympanic membrane and ear canal normal.  ?   Nose: No congestion or rhinorrhea.  ?   Mouth/Throat:  ?   Mouth: Mucous membranes are moist.  ?   Pharynx: Posterior oropharyngeal erythema present.  ?   Tonsils: No tonsillar exudate. 0 on the right. 0 on the left.  ?Cardiovascular:  ?   Rate and Rhythm: Normal rate and regular rhythm.  ?   Heart sounds: Normal heart sounds.  ?Pulmonary:  ?   Effort: Pulmonary effort is normal.  ?   Breath sounds: Normal breath sounds.  ?Musculoskeletal:  ?   Cervical back: Normal range of motion.  ?Lymphadenopathy:  ?   Cervical: Cervical adenopathy present.  ?Skin: ?   General: Skin is warm and dry.  ?Neurological:  ?   General: No focal deficit present.  ?   Mental Status: She is alert and oriented to person, place, and time.  ?Psychiatric:     ?   Mood and Affect: Mood normal.     ?   Behavior: Behavior normal.  ? ? ? ?UC Treatments / Results  ?Labs ?(all labs ordered are listed, but only abnormal results are displayed) ?Labs Reviewed  ?GROUP A STREP BY PCR  ? ? ?EKG ? ? ?Radiology ?No results found. ? ?Procedures ?Procedures (including critical care time) ? ?Medications Ordered in UC ?Medications - No data to display ? ?Initial Impression / Assessment and Plan / UC Course  ?I have reviewed the triage vital signs and the nursing notes. ? ?Pertinent labs & imaging results that were available during my care of the patient were reviewed by me and considered in my medical decision making (see chart for  details). ? ?Viral pharyngitis ? ?Strep PCR negative, discussed findings with patient, discussed etiology of symptoms most likely viral and should resolve on  its own, given prescriptions for viscous lidocaine and ibuprofen 800 mg to be used as needed, patient would like to attempt use of gargling over-the-counter Chloraseptic spray first, may continue use of over-the-counter medications as needed for supportive care, recommended follow-up with primary care doctor if symptoms continue to persist ?Final Clinical Impressions(s) / UC Diagnoses  ? ?Final diagnoses:  ?None  ? ?Discharge Instructions   ?None ?  ? ?ED Prescriptions   ?None ?  ? ?PDMP not reviewed this encounter. ?  ?Hans Eden, NP ?07/18/21 1737 ? ?

## 2021-07-18 NOTE — ED Triage Notes (Signed)
Pt states last Tuesday she had laryngitis and since then her throat hasn't gotten better. Pt states she has been using OTC medication and salt water rinses with no relief.  ?

## 2022-01-03 ENCOUNTER — Other Ambulatory Visit: Payer: Self-pay | Admitting: Nurse Practitioner

## 2022-01-03 DIAGNOSIS — Z1231 Encounter for screening mammogram for malignant neoplasm of breast: Secondary | ICD-10-CM

## 2022-01-24 ENCOUNTER — Ambulatory Visit
Admission: RE | Admit: 2022-01-24 | Discharge: 2022-01-24 | Disposition: A | Discharge status: Home / Self Care | Payer: Medicare PPO | Source: Ambulatory Visit | Attending: Nurse Practitioner | Admitting: Nurse Practitioner

## 2022-01-24 DIAGNOSIS — Z1231 Encounter for screening mammogram for malignant neoplasm of breast: Secondary | ICD-10-CM | POA: Diagnosis not present

## 2022-06-21 DIAGNOSIS — M720 Palmar fascial fibromatosis [Dupuytren]: Secondary | ICD-10-CM | POA: Diagnosis not present

## 2022-06-21 DIAGNOSIS — E119 Type 2 diabetes mellitus without complications: Secondary | ICD-10-CM | POA: Diagnosis not present

## 2022-07-04 DIAGNOSIS — I152 Hypertension secondary to endocrine disorders: Secondary | ICD-10-CM | POA: Diagnosis not present

## 2022-07-04 DIAGNOSIS — Z79899 Other long term (current) drug therapy: Secondary | ICD-10-CM | POA: Diagnosis not present

## 2022-07-04 DIAGNOSIS — E7800 Pure hypercholesterolemia, unspecified: Secondary | ICD-10-CM | POA: Diagnosis not present

## 2022-07-04 DIAGNOSIS — Z87891 Personal history of nicotine dependence: Secondary | ICD-10-CM | POA: Diagnosis not present

## 2022-07-04 DIAGNOSIS — E119 Type 2 diabetes mellitus without complications: Secondary | ICD-10-CM | POA: Diagnosis not present

## 2022-07-22 DIAGNOSIS — R32 Unspecified urinary incontinence: Secondary | ICD-10-CM | POA: Diagnosis not present

## 2022-07-22 DIAGNOSIS — E1136 Type 2 diabetes mellitus with diabetic cataract: Secondary | ICD-10-CM | POA: Diagnosis not present

## 2022-07-22 DIAGNOSIS — I159 Secondary hypertension, unspecified: Secondary | ICD-10-CM | POA: Diagnosis not present

## 2022-07-22 DIAGNOSIS — Z87891 Personal history of nicotine dependence: Secondary | ICD-10-CM | POA: Diagnosis not present

## 2022-07-22 DIAGNOSIS — E785 Hyperlipidemia, unspecified: Secondary | ICD-10-CM | POA: Diagnosis not present

## 2022-07-22 DIAGNOSIS — K5900 Constipation, unspecified: Secondary | ICD-10-CM | POA: Diagnosis not present

## 2022-07-22 DIAGNOSIS — H259 Unspecified age-related cataract: Secondary | ICD-10-CM | POA: Diagnosis not present

## 2022-07-22 DIAGNOSIS — M1990 Unspecified osteoarthritis, unspecified site: Secondary | ICD-10-CM | POA: Diagnosis not present

## 2022-07-22 DIAGNOSIS — M810 Age-related osteoporosis without current pathological fracture: Secondary | ICD-10-CM | POA: Diagnosis not present

## 2022-12-26 DIAGNOSIS — Z0100 Encounter for examination of eyes and vision without abnormal findings: Secondary | ICD-10-CM | POA: Diagnosis not present

## 2022-12-26 DIAGNOSIS — H04123 Dry eye syndrome of bilateral lacrimal glands: Secondary | ICD-10-CM | POA: Diagnosis not present

## 2022-12-26 DIAGNOSIS — E119 Type 2 diabetes mellitus without complications: Secondary | ICD-10-CM | POA: Diagnosis not present

## 2022-12-26 DIAGNOSIS — H2513 Age-related nuclear cataract, bilateral: Secondary | ICD-10-CM | POA: Diagnosis not present

## 2022-12-26 DIAGNOSIS — H43813 Vitreous degeneration, bilateral: Secondary | ICD-10-CM | POA: Diagnosis not present

## 2023-01-11 DIAGNOSIS — Z872 Personal history of diseases of the skin and subcutaneous tissue: Secondary | ICD-10-CM | POA: Diagnosis not present

## 2023-01-11 DIAGNOSIS — L578 Other skin changes due to chronic exposure to nonionizing radiation: Secondary | ICD-10-CM | POA: Diagnosis not present

## 2023-01-11 DIAGNOSIS — Z86018 Personal history of other benign neoplasm: Secondary | ICD-10-CM | POA: Diagnosis not present

## 2023-01-11 DIAGNOSIS — L570 Actinic keratosis: Secondary | ICD-10-CM | POA: Diagnosis not present

## 2023-01-15 DIAGNOSIS — I152 Hypertension secondary to endocrine disorders: Secondary | ICD-10-CM | POA: Diagnosis not present

## 2023-01-15 DIAGNOSIS — Z79899 Other long term (current) drug therapy: Secondary | ICD-10-CM | POA: Diagnosis not present

## 2023-01-15 DIAGNOSIS — M8589 Other specified disorders of bone density and structure, multiple sites: Secondary | ICD-10-CM | POA: Diagnosis not present

## 2023-01-15 DIAGNOSIS — E119 Type 2 diabetes mellitus without complications: Secondary | ICD-10-CM | POA: Diagnosis not present

## 2023-01-15 DIAGNOSIS — Z1331 Encounter for screening for depression: Secondary | ICD-10-CM | POA: Diagnosis not present

## 2023-01-15 DIAGNOSIS — E7800 Pure hypercholesterolemia, unspecified: Secondary | ICD-10-CM | POA: Diagnosis not present

## 2023-01-15 DIAGNOSIS — Z0000 Encounter for general adult medical examination without abnormal findings: Secondary | ICD-10-CM | POA: Diagnosis not present

## 2023-01-15 DIAGNOSIS — Z1211 Encounter for screening for malignant neoplasm of colon: Secondary | ICD-10-CM | POA: Diagnosis not present

## 2023-01-24 DIAGNOSIS — Z1211 Encounter for screening for malignant neoplasm of colon: Secondary | ICD-10-CM | POA: Diagnosis not present

## 2023-07-20 DIAGNOSIS — E119 Type 2 diabetes mellitus without complications: Secondary | ICD-10-CM | POA: Diagnosis not present

## 2023-07-20 DIAGNOSIS — Z79899 Other long term (current) drug therapy: Secondary | ICD-10-CM | POA: Diagnosis not present

## 2023-07-20 DIAGNOSIS — M25671 Stiffness of right ankle, not elsewhere classified: Secondary | ICD-10-CM | POA: Diagnosis not present

## 2023-07-20 DIAGNOSIS — I152 Hypertension secondary to endocrine disorders: Secondary | ICD-10-CM | POA: Diagnosis not present

## 2023-07-20 DIAGNOSIS — E7800 Pure hypercholesterolemia, unspecified: Secondary | ICD-10-CM | POA: Diagnosis not present

## 2023-12-14 DIAGNOSIS — H04123 Dry eye syndrome of bilateral lacrimal glands: Secondary | ICD-10-CM | POA: Diagnosis not present

## 2023-12-14 DIAGNOSIS — Z0100 Encounter for examination of eyes and vision without abnormal findings: Secondary | ICD-10-CM | POA: Diagnosis not present

## 2023-12-14 DIAGNOSIS — E119 Type 2 diabetes mellitus without complications: Secondary | ICD-10-CM | POA: Diagnosis not present

## 2023-12-14 DIAGNOSIS — H2513 Age-related nuclear cataract, bilateral: Secondary | ICD-10-CM | POA: Diagnosis not present

## 2023-12-14 DIAGNOSIS — H43813 Vitreous degeneration, bilateral: Secondary | ICD-10-CM | POA: Diagnosis not present

## 2024-01-09 DIAGNOSIS — L249 Irritant contact dermatitis, unspecified cause: Secondary | ICD-10-CM | POA: Diagnosis not present

## 2024-01-09 DIAGNOSIS — L918 Other hypertrophic disorders of the skin: Secondary | ICD-10-CM | POA: Diagnosis not present

## 2024-01-09 DIAGNOSIS — Z872 Personal history of diseases of the skin and subcutaneous tissue: Secondary | ICD-10-CM | POA: Diagnosis not present

## 2024-01-09 DIAGNOSIS — B078 Other viral warts: Secondary | ICD-10-CM | POA: Diagnosis not present

## 2024-01-09 DIAGNOSIS — L578 Other skin changes due to chronic exposure to nonionizing radiation: Secondary | ICD-10-CM | POA: Diagnosis not present

## 2024-01-09 DIAGNOSIS — L738 Other specified follicular disorders: Secondary | ICD-10-CM | POA: Diagnosis not present

## 2024-01-09 DIAGNOSIS — Z86018 Personal history of other benign neoplasm: Secondary | ICD-10-CM | POA: Diagnosis not present

## 2024-01-09 DIAGNOSIS — L570 Actinic keratosis: Secondary | ICD-10-CM | POA: Diagnosis not present

## 2024-01-18 ENCOUNTER — Encounter: Payer: Self-pay | Admitting: Emergency Medicine

## 2024-01-18 ENCOUNTER — Ambulatory Visit
Admission: EM | Admit: 2024-01-18 | Discharge: 2024-01-18 | Disposition: A | Discharge status: Home / Self Care | Attending: Physician Assistant | Admitting: Physician Assistant

## 2024-01-18 DIAGNOSIS — I10 Essential (primary) hypertension: Secondary | ICD-10-CM

## 2024-01-18 DIAGNOSIS — S90869A Insect bite (nonvenomous), unspecified foot, initial encounter: Secondary | ICD-10-CM

## 2024-01-18 DIAGNOSIS — W57XXXA Bitten or stung by nonvenomous insect and other nonvenomous arthropods, initial encounter: Secondary | ICD-10-CM

## 2024-01-18 DIAGNOSIS — L500 Allergic urticaria: Secondary | ICD-10-CM

## 2024-01-18 MED ORDER — DEXAMETHASONE SODIUM PHOSPHATE 10 MG/ML IJ SOLN
10.0000 mg | Freq: Once | INTRAMUSCULAR | Status: AC
  Administered 2024-01-18: 10 mg via INTRAMUSCULAR

## 2024-01-18 MED ORDER — CETIRIZINE HCL 10 MG PO TABS: 10.0000 mg | ORAL_TABLET | Freq: Two times a day (BID) | ORAL | 0 refills | Status: AC

## 2024-01-18 MED ORDER — FAMOTIDINE 20 MG PO TABS: 20.0000 mg | ORAL_TABLET | Freq: Two times a day (BID) | ORAL | 0 refills | Status: AC

## 2024-01-18 MED ORDER — PREDNISONE 10 MG PO TABS: ORAL_TABLET | ORAL | 0 refills | Status: AC

## 2024-01-18 NOTE — Discharge Instructions (Addendum)
-   Take 1 to 2 tablets Benadryl every 4-6 hours until rash resolves. - Start the cetirizine  and take as directed until rash resolves. - Start the famotidine  and take as directed until rash resolves. - We gave you an injection of dexamethasone  corticosteroid in the clinic.  You may start the prednisone  taper tomorrow. - May use topical Benadryl, ice and elevation. - If you have throat swelling or breathing difficulty, call 911. -BP is very high. Check BP regularly at home and if >140/90 consistently follow up with PCP. If you have associated headaches, vision changes, chest pain, speech or balance problems, confusion, racing heart or shortness of breath, proceed to ER.

## 2024-01-18 NOTE — ED Provider Notes (Signed)
 [facility]    CSN: [id number] Arrival date & time: 01/18/24  1357      History   Chief Complaint Chief Complaint  Patient presents with   Insect Bite    HPI Patricia Bernard is a 73 y.o. female presenting for pruritic generalized rash today. Patient says she was bitten on both ankles by 1-2 million fire ants about 2 hours ago before onset of hives.   Patient denies swelling of face, tongue or throat. No chest tightness of shortness of breath.   Patient has taken 1 tablet of Benadryl and used topical Benadryl cream.   History of hypertension managed without medications.   HPI  Past Medical History:  Diagnosis Date   Complication of anesthesia    Difficulty swallowing after surgery.    There are no active problems to display for this patient.   Past Surgical History:  Procedure Laterality Date   CESAREAN SECTION  1984   ROBOTIC ASSISTED SALPINGO OOPHERECTOMY Right 12/06/2018   Procedure: ROBOTIC ASSISTED SALPINGO OOPHORECTOMY;  Surgeon: Ward, Mitzie BROCKS, MD;  Location: [facility];  Service: Gynecology;  Laterality: Right;   SUBMANDIBULAR GLAND EXCISION  2014   XI ROBOTIC ASSISTED INGUINAL HERNIA REPAIR WITH MESH N/A 12/06/2018   Procedure: XI ROBOTIC ASSISTED FEMORAL HERNIA REPAIR WITH MESH;  Surgeon: Tye Millet, DO;  Location: [facility];  Service: General;  Laterality: N/A;    OB History   No obstetric history on file.      Home Medications    Prior to Admission medications   Medication Sig Start Date End Date Taking? Authorizing Provider  cetirizine  (ZYRTEC ) 10 MG tablet Take 1 tablet (10 mg total) by mouth 2 (two) times daily for 5 days. 01/18/24 01/23/24 Yes Arvis Huxley B, PA-C  famotidine  (PEPCID ) 20 MG tablet Take 1 tablet (20 mg total) by mouth 2 (two) times daily for 5 days. 01/18/24 01/23/24 Yes Arvis Huxley NOVAK, PA-C  Garlic 10 MG CAPS Take 10 mg by mouth daily.   Yes [provider]  magnesium oxide (MAG-OX) 400 MG tablet Take 400 mg by  mouth daily.   Yes [provider]  Omega-3 Fatty Acids (FISH OIL) 1000 MG CAPS Take 1,000 mg by mouth daily.   Yes [provider]  predniSONE  (DELTASONE ) 10 MG tablet Take 6 tabs p.o. on day 1 and decrease by 1 tablet daily until complete 01/18/24  Yes Arvis Huxley B, PA-C  rosuvastatin (CRESTOR) 5 MG tablet Take 5 mg by mouth daily.   Yes [provider]  vitamin C (ASCORBIC ACID) 500 MG tablet Take 500 mg by mouth daily.   Yes [provider]  amoxicillin -clavulanate (AUGMENTIN ) 875-125 MG tablet Take 1 tablet by mouth every 12 (twelve) hours. 09/24/20   Burky, Natalie B, NP  aspirin EC 81 MG tablet Take 81 mg by mouth daily.    [provider]  ibuprofen  (ADVIL ) 800 MG tablet Take 1 tablet (800 mg total) by mouth every 8 (eight) hours as needed for mild pain or moderate pain. 12/06/18   Sakai, Isami, DO  ibuprofen  (ADVIL ) 800 MG tablet Take 1 tablet (800 mg total) by mouth 3 (three) times daily. 07/18/21   White, Shelba SAUNDERS, NP  lidocaine  (XYLOCAINE ) 2 % solution Use as directed 15 mLs in the mouth or throat as needed. 07/18/21   Teresa Shelba SAUNDERS, NP    Family History Family History  Problem Relation Age of Onset   Breast cancer Neg Hx  Social History Social History   Tobacco Use   Smoking status: Never   Smokeless tobacco: Never  Vaping Use   Vaping status: Never Used  Substance Use Topics   Alcohol use: Yes    Comment: Occasional   Drug use: Not Currently     Allergies   Boniva [ibandronate]   Review of Systems Review of Systems  Constitutional:  Negative for fatigue.  HENT:  Negative for trouble swallowing.   Respiratory:  Negative for chest tightness, shortness of breath and wheezing.   Gastrointestinal:  Negative for abdominal pain, nausea and vomiting.  Musculoskeletal:  Positive for joint swelling.  Skin:  Positive for rash.  Neurological:  Negative for dizziness and weakness.     Physical Exam Triage Vital  Signs ED Triage Vitals [01/18/24 1409]  Encounter Vitals Group     BP      Girls Systolic BP Percentile      Girls Diastolic BP Percentile      Boys Systolic BP Percentile      Boys Diastolic BP Percentile      Pulse      Resp      Temp      Temp src      SpO2      Weight 108 lb 14.5 oz (49.4 kg)     Height 5' 1 (1.549 m)     Head Circumference      Peak Flow      Pain Score 0     Pain Loc      Pain Education      Exclude from Growth Chart    No data found.  Updated Vital Signs BP (!) 198/93   Pulse 75   Temp 97.8 F (36.6 C) (Oral)   Resp 16   Ht 5' 1 (1.549 m)   Wt 108 lb 14.5 oz (49.4 kg)   SpO2 96%   BMI 20.58 kg/m        182/86 07/20/2023 8:29 AM EDT    110/68 01/15/2023 8:19 AM EDT      154/86 07/04/2022 7:59 AM EDT    Physical Exam Vitals and nursing note reviewed.  Constitutional:      General: She is not in acute distress.    Appearance: Normal appearance. She is not ill-appearing or toxic-appearing.  HENT:     Head: Normocephalic and atraumatic.     Nose: Nose normal.     Mouth/Throat:     Mouth: Mucous membranes are moist.     Pharynx: Oropharynx is clear.     Comments: No intraoral or facial swelling Eyes:     General: No scleral icterus.       Right eye: No discharge.        Left eye: No discharge.     Conjunctiva/sclera: Conjunctivae normal.  Cardiovascular:     Rate and Rhythm: Normal rate and regular rhythm.     Heart sounds: Normal heart sounds.  Pulmonary:     Effort: Pulmonary effort is normal. No respiratory distress.     Breath sounds: Normal breath sounds.  Musculoskeletal:     Cervical back: Neck supple.  Skin:    General: Skin is dry.     Findings: Rash present.     Comments: Generalized erythematous urticarial rash.   Erythema and swelling of bilateral feet.  Neurological:     General: No focal deficit present.     Mental Status: She is alert. Mental status is at baseline.  Motor: No weakness.     Gait: Gait  normal.  Psychiatric:        Mood and Affect: Mood is anxious.        Behavior: Behavior normal.           UC Treatments / Results  Labs (all labs ordered are listed, but only abnormal results are displayed) Labs Reviewed - No data to display  EKG   Radiology No results found.  Procedures Procedures (including critical care time)  Medications Ordered in UC Medications  dexamethasone  (DECADRON ) injection 10 mg (10 mg Intramuscular Given 01/18/24 1437)    Initial Impression / Assessment and Plan / UC Course  I have reviewed the triage vital signs and the nursing notes.  Pertinent labs & imaging results that were available during my care of the patient were reviewed by me and considered in my medical decision making (see chart for details).   73 y/o female presents for hives following fire ant bites to ankles that occurred in the past 2 hours. Patient has taken 1 tablet of Benadryl and used topical Benadryl.  BP 193/94. Repeat is 198/93. Reviewed medical record. History of chronic stable uncontrolled hypertension per PCP. Patient says BP is usually not this high and she thinks it is because she is stressed about  her current condition.   She is in no acute distress and overall well appearing. No facial or intraoral swelling. Generalized urticarial rash and swelling/erythema of bilateral feet. Chest clear. Heart RRR.   Insect bite and urticaria. Patient given dexamethasone  10 mg IM. Sent prednisone  for her to begin tomorrow. Discussed takig Benadryl, cetirizine , and famotidine .   Reviewed BP is very high and corticosteroids can increase higher. Advised to check at home and keep log. If high and symptomatic advised going to ER. Otherwise advised following up with PCP.    Final Clinical Impressions(s) / UC Diagnoses   Final diagnoses:  Allergic urticaria  Insect bite of foot, unspecified laterality, initial encounter  Essential hypertension     Discharge  Instructions      - Take 1 to 2 tablets Benadryl every 4-6 hours until rash resolves. - Start the cetirizine  and take as directed until rash resolves. - Start the famotidine  and take as directed until rash resolves. - We gave you an injection of dexamethasone  corticosteroid in the clinic.  You may start the prednisone  taper tomorrow. - May use topical Benadryl, ice and elevation. - If you have throat swelling or breathing difficulty, call 911. -BP is very high. Check BP regularly at home and if >140/90 consistently follow up with PCP. If you have associated headaches, vision changes, chest pain, speech or balance problems, confusion, racing heart or shortness of breath, proceed to ER.     ED Prescriptions     Medication Sig Dispense Auth. Provider   famotidine  (PEPCID ) 20 MG tablet Take 1 tablet (20 mg total) by mouth 2 (two) times daily for 5 days. 10 tablet Arvis Huxley B, PA-C   cetirizine  (ZYRTEC ) 10 MG tablet Take 1 tablet (10 mg total) by mouth 2 (two) times daily for 5 days. 10 tablet Arvis Huxley NOVAK, PA-C   predniSONE  (DELTASONE ) 10 MG tablet Take 6 tabs p.o. on day 1 and decrease by 1 tablet daily until complete 21 tablet Arvis Huxley NOVAK, PA-C      PDMP not reviewed this encounter.   Arvis Huxley NOVAK, PA-C 01/18/24 [phone]

## 2024-01-18 NOTE — ED Triage Notes (Addendum)
 Pt states she was bitten on bilateral ankles by ants. This occurred at about 12:30 today. She has a rash over her entire body and it extremely itchy. She states she has taken benadryl. Denies sob, or throat swelling.

## 2024-02-15 ENCOUNTER — Other Ambulatory Visit: Payer: Self-pay | Admitting: Nurse Practitioner

## 2024-02-15 DIAGNOSIS — Z0000 Encounter for general adult medical examination without abnormal findings: Secondary | ICD-10-CM | POA: Diagnosis not present

## 2024-02-15 DIAGNOSIS — E119 Type 2 diabetes mellitus without complications: Secondary | ICD-10-CM | POA: Diagnosis not present

## 2024-02-15 DIAGNOSIS — Z79899 Other long term (current) drug therapy: Secondary | ICD-10-CM | POA: Diagnosis not present

## 2024-02-15 DIAGNOSIS — Z1211 Encounter for screening for malignant neoplasm of colon: Secondary | ICD-10-CM | POA: Diagnosis not present

## 2024-02-15 DIAGNOSIS — I10 Essential (primary) hypertension: Secondary | ICD-10-CM | POA: Diagnosis not present

## 2024-02-15 DIAGNOSIS — Z1231 Encounter for screening mammogram for malignant neoplasm of breast: Secondary | ICD-10-CM

## 2024-02-15 DIAGNOSIS — M8580 Other specified disorders of bone density and structure, unspecified site: Secondary | ICD-10-CM | POA: Diagnosis not present

## 2024-02-15 DIAGNOSIS — E785 Hyperlipidemia, unspecified: Secondary | ICD-10-CM | POA: Diagnosis not present

## 2024-02-15 DIAGNOSIS — Z1331 Encounter for screening for depression: Secondary | ICD-10-CM | POA: Diagnosis not present

## 2024-03-04 DIAGNOSIS — M810 Age-related osteoporosis without current pathological fracture: Secondary | ICD-10-CM | POA: Diagnosis not present

## 2024-03-07 DIAGNOSIS — Z1211 Encounter for screening for malignant neoplasm of colon: Secondary | ICD-10-CM | POA: Diagnosis not present

## 2024-03-25 ENCOUNTER — Ambulatory Visit
Admission: RE | Admit: 2024-03-25 | Discharge: 2024-03-25 | Disposition: A | Discharge status: Home / Self Care | Source: Ambulatory Visit | Attending: Nurse Practitioner | Admitting: Nurse Practitioner

## 2024-03-25 DIAGNOSIS — Z1231 Encounter for screening mammogram for malignant neoplasm of breast: Secondary | ICD-10-CM | POA: Insufficient documentation
